# Patient Record
Sex: Male | Born: 1945 | Race: White | Hispanic: No | Marital: Married | Smoking: Never smoker
Health system: Southern US, Community
[De-identification: ages and names within clinical notes are randomized; demographics above are authoritative.]

## PROBLEM LIST (undated history)

## (undated) DIAGNOSIS — Z5189 Encounter for other specified aftercare: Secondary | ICD-10-CM

## (undated) DIAGNOSIS — I10 Essential (primary) hypertension: Secondary | ICD-10-CM

## (undated) DIAGNOSIS — F329 Major depressive disorder, single episode, unspecified: Secondary | ICD-10-CM

## (undated) DIAGNOSIS — H409 Unspecified glaucoma: Secondary | ICD-10-CM

## (undated) DIAGNOSIS — R413 Other amnesia: Secondary | ICD-10-CM

## (undated) DIAGNOSIS — D649 Anemia, unspecified: Secondary | ICD-10-CM

## (undated) DIAGNOSIS — H269 Unspecified cataract: Secondary | ICD-10-CM

## (undated) DIAGNOSIS — J40 Bronchitis, not specified as acute or chronic: Secondary | ICD-10-CM

## (undated) DIAGNOSIS — F32A Depression, unspecified: Secondary | ICD-10-CM

## (undated) DIAGNOSIS — C801 Malignant (primary) neoplasm, unspecified: Secondary | ICD-10-CM

## (undated) DIAGNOSIS — F410 Panic disorder [episodic paroxysmal anxiety] without agoraphobia: Secondary | ICD-10-CM

## (undated) HISTORY — DX: Unspecified cataract: H26.9

## (undated) HISTORY — PX: POLYPECTOMY: SHX149

## (undated) HISTORY — DX: Anemia, unspecified: D64.9

## (undated) HISTORY — PX: COLONOSCOPY: SHX174

## (undated) HISTORY — DX: Unspecified glaucoma: H40.9

## (undated) HISTORY — DX: Malignant (primary) neoplasm, unspecified: C80.1

## (undated) HISTORY — PX: MELANOMA EXCISION: SHX5266

## (undated) HISTORY — DX: Encounter for other specified aftercare: Z51.89

---

## 1998-02-09 ENCOUNTER — Other Ambulatory Visit: Admission: RE | Admit: 1998-02-09 | Discharge: 1998-02-09 | Payer: Self-pay

## 1999-03-16 ENCOUNTER — Emergency Department (HOSPITAL_COMMUNITY): Admission: EM | Admit: 1999-03-16 | Discharge: 1999-03-16 | Payer: Self-pay | Admitting: *Deleted

## 2007-04-30 ENCOUNTER — Emergency Department (HOSPITAL_COMMUNITY): Admission: EM | Admit: 2007-04-30 | Discharge: 2007-04-30 | Payer: Self-pay | Admitting: Emergency Medicine

## 2007-05-13 ENCOUNTER — Ambulatory Visit: Payer: Self-pay | Admitting: Gastroenterology

## 2007-05-24 ENCOUNTER — Encounter: Payer: Self-pay | Admitting: Gastroenterology

## 2007-05-24 ENCOUNTER — Ambulatory Visit: Payer: Self-pay | Admitting: Gastroenterology

## 2008-04-21 ENCOUNTER — Emergency Department (HOSPITAL_COMMUNITY): Admission: EM | Admit: 2008-04-21 | Discharge: 2008-04-21 | Payer: Self-pay | Admitting: Emergency Medicine

## 2008-10-22 ENCOUNTER — Observation Stay (HOSPITAL_COMMUNITY): Admission: EM | Admit: 2008-10-22 | Discharge: 2008-10-23 | Payer: Self-pay | Admitting: *Deleted

## 2008-10-22 ENCOUNTER — Encounter (INDEPENDENT_AMBULATORY_CARE_PROVIDER_SITE_OTHER): Payer: Self-pay | Admitting: Internal Medicine

## 2010-11-17 LAB — CBC
MCHC: 34.3 g/dL (ref 30.0–36.0)
MCHC: 34.5 g/dL (ref 30.0–36.0)
MCV: 97.8 fL (ref 78.0–100.0)
MCV: 99.2 fL (ref 78.0–100.0)
Platelets: 186 10*3/uL (ref 150–400)
RBC: 4.11 MIL/uL — ABNORMAL LOW (ref 4.22–5.81)
RDW: 13.1 % (ref 11.5–15.5)

## 2010-11-17 LAB — BASIC METABOLIC PANEL
BUN: 10 mg/dL (ref 6–23)
BUN: 9 mg/dL (ref 6–23)
CO2: 24 mEq/L (ref 19–32)
CO2: 24 mEq/L (ref 19–32)
Chloride: 101 mEq/L (ref 96–112)
Chloride: 107 mEq/L (ref 96–112)
Creatinine, Ser: 0.94 mg/dL (ref 0.4–1.5)
Creatinine, Ser: 1.1 mg/dL (ref 0.4–1.5)
GFR calc Af Amer: >60 mL/min (ref >60)
Glucose, Bld: 112 mg/dL — ABNORMAL HIGH (ref 70–99)

## 2010-11-17 LAB — POCT CARDIAC MARKERS
Myoglobin, poc: 96.2 ng/mL (ref 12–200)
Troponin i, poc: <0.05 ng/mL (ref 0.00–0.09)

## 2010-11-17 LAB — DIFFERENTIAL
Basophils Relative: 0 % (ref 0–1)
Eosinophils Absolute: 0.1 10*3/uL (ref 0.0–0.7)
Monocytes Relative: 12 % (ref 3–12)
Neutrophils Relative %: 56 % (ref 43–77)

## 2010-12-20 NOTE — Discharge Summary (Signed)
Brian Maxwell, KOMAN         ACCOUNT NO.:  [id number]   MEDICAL RECORD NO.:  [id number]          PATIENT TYPE:  OBV   LOCATION:  1425                         FACILITY:  [facility]   PHYSICIAN:  Theodosia Paling, MD    DATE OF BIRTH:  06/13/1946   DATE OF ADMISSION:  10/22/2008  DATE OF DISCHARGE:  10/23/2008                               DISCHARGE SUMMARY   PRIMARY CARE PHYSICIAN:  Dr. Louanna Raw.   ADMITTING HISTORY:  Please refer to the admission note dictated by me  under history of present illness.   DISCHARGE DIAGNOSES:  1. Nocturnal cough.  2. History of hypertension.  3. Gout.  4. History of glaucoma.   DISCHARGE MEDICATIONS:  The patient is to continue home medications:  1. Wellbutrin 150 mg p.o. daily.  2. Xalatan 1 drop topical daily.  3. Diovan 150 mg p.o. daily.  4. Probenecid and colchicine at whatever dose he was taking before.   New medications added:  1. Robitussin DM 1 teaspoon p.o. q.8h. p.r.n.  2. Maalox 1 teaspoon p.o. q.8h. p.r.n.  3. Sudafed 1 tablet p.o. q.12h. for three days.  4. Azithromycin 250 mg p.o. daily for 3 days.  5. Omeprazole 20 mg p.o. q.12h. for a month.   PROCEDURE PERFORMED:  None.   IMAGING PERFORMED:  1. Chest x-ray on October 22, 2008, showing no acute finding in the      chest.  No radiographic  evidence of pneumonia found.  2. Echocardiogram.  Echocardiogram showing EF of 55%.  The study was      inadequate for evaluation of left ventricular regional wall motion      defect.  Redundancy anterior septum with borderline criteria for      atrial septal aneurysm.   CONSULTATION PERFORMED:  None.   DISPOSITION:  The patient is going home.  He will follow up with Dr.  Louanna Raw in 1 week time for follow-up of his symptoms improvement.   Total time spent in discharge 35 minutes.      Theodosia Paling, MD  Electronically Signed     NP/MEDQ  D:  10/23/2008  T:  10/23/2008  Job:  [id number]   cc:   Louanna Raw  Fax:  [phone]

## 2010-12-20 NOTE — H&P (Signed)
Brian Maxwell, CRANSTON NO.:  [id number]   MEDICAL RECORD NO.:  [id number]          PATIENT TYPE:  OBV   LOCATION:  0101                         FACILITY:  [facility]   PHYSICIAN:  Theodosia Paling, MD    DATE OF BIRTH:  Dec 29, 1945   DATE OF ADMISSION:  10/22/2008  DATE OF DISCHARGE:                              HISTORY & PHYSICAL   PRIMARY CARE PHYSICIAN:  The patient follows with Dr. Louanna Raw.   CHIEF COMPLAINT:  Shortness of breath and bad coughing spells at night.   HISTORY OF PRESENT ILLNESS:  Mr. Brian Maxwell is a 65 year old gentleman  with otherwise good health who is presenting with a chief complaint of  nocturnal dyspnea and coughing spells at night.  He feels better when he  is bending forward.  He says the coughing spells and shortness of breath  are so bad that he has not been able to get a good night's sleep for the  last 2 nights and has come to the emergency room for further evaluation  and management.   PAST MEDICAL HISTORY:  1. Significant for hypertension.  2. Gout.  3. Glaucoma.  4. History of dizziness for which he presented to the ER 6 months      back.   REVIEW OF SYSTEMS:  Essentially negative except for sinus congestion and  nasal congestion.  Denies heartburn.  Denies pedal edema.   SOCIAL HISTORY:  Works as a Haematologist at [employer].  Denies  tobacco, alcohol or IV drug abuse.   FAMILY HISTORY:  Mother died of heart failure.  Otherwise, no other  significant family medical history.   ALLERGIES:  No allergies.   PAST SURGICAL HISTORY:  None.   HOME MEDICATIONS:  The patient takes the following medications:  1. Wellbutrin 150 mg p.o. daily.  2. Xalatan one drop topical daily.  3. Diovan 150 mg p.o. daily.  4. Benecid and colchicine.  He does not remember the dose.  5. Sudafed PE occasionally he takes 1 tablet at bedtime.   PHYSICAL EXAMINATION:  VITAL SIGNS:  Blood pressure 120/80, respiratory  rate 16, oxygen saturation  98% at room air, afebrile.  GENERAL:  No acute cardiorespiratory distress.  LUNGS:  The patient has normal breath sounds.  No rhonchi or  crepitations.  CARDIOVASCULAR:  S1, S2 normal.  No murmur, gallop or rub.  GASTROINTESTINAL:  Soft, obese, nontender.  No organomegaly.  PSYCHIATRIC:  Oriented x3.  CNS:  Speech intact.  Follows commands.  EXTREMITIES:  No pedal edema.   LAB DATA:  CBC - hemoglobin 13.9, hematocrit 40.2, platelet count 208,  sodium 137, potassium 2.9, chloride 107, bicarbonate 24, glucose 112,  BUN 10, creatinine 1.10.  Cardiac enzymes - three set of cardiac enzymes  are negative.  EKG normal sinus rhythm.   ASSESSMENT AND PLAN:  1. Nocturnal cough.  At this time differential includes acute      bronchitis/postnasal drip/gastroesophageal reflux disease causing      dry cough/congestive heart failure.  Will admit the patient and      obtain an echocardiogram.  Continue Sudafed.  We will give him p.o.  azithromycin and proton pump inhibitor.  Most likely the patient      will be able to go home tomorrow if he feels better.  2. Hypertension.  Continue Diovan.  3. Gout.  At this time there is no acute exacerbation.  4. Glaucoma.  Continue eye drops.  5. Prophylaxis.  DVT and GI prophylaxis requested.   CODE STATUS:  The patient is full code.  Total time spent in admission  of this patient is 35 minutes.      Theodosia Paling, MD  Electronically Signed     NP/MEDQ  D:  10/22/2008  T:  10/22/2008  Job:  [id number]   cc:   Louanna Raw  Fax: [phone]

## 2011-05-08 LAB — POCT I-STAT, CHEM 8
BUN: 16
Calcium, Ion: 1.19
HCT: 42
TCO2: 27

## 2011-05-08 LAB — URINALYSIS, ROUTINE W REFLEX MICROSCOPIC
Ketones, ur: NEGATIVE
Nitrite: NEGATIVE
Protein, ur: NEGATIVE

## 2011-05-18 LAB — HEPATIC FUNCTION PANEL
Albumin: 4
Alkaline Phosphatase: 68
Total Protein: 6.7

## 2011-05-18 LAB — I-STAT 8, (EC8 V) (CONVERTED LAB)
Acid-Base Excess: 1
Bicarbonate: 24.9 — ABNORMAL HIGH
HCT: 44
Operator id: 294501
TCO2: 26
pCO2, Ven: 38.1 — ABNORMAL LOW
pH, Ven: 7.424 — ABNORMAL HIGH

## 2011-05-18 LAB — DIFFERENTIAL
Basophils Absolute: 0
Eosinophils Absolute: 0.1
Lymphs Abs: 1.9
Neutrophils Relative %: 65

## 2011-05-18 LAB — CBC
MCV: 94.5
Platelets: 244
RDW: 12.8
WBC: 7.9

## 2011-05-18 LAB — POCT I-STAT CREATININE
Creatinine, Ser: 1.1
Operator id: 294501

## 2011-05-18 LAB — PROTIME-INR
INR: 0.9
Prothrombin Time: 12.7

## 2012-01-19 ENCOUNTER — Other Ambulatory Visit: Payer: Self-pay | Admitting: Dermatology

## 2012-01-23 ENCOUNTER — Other Ambulatory Visit: Payer: Self-pay | Admitting: Dermatology

## 2012-02-01 ENCOUNTER — Other Ambulatory Visit: Payer: Self-pay | Admitting: Dermatology

## 2012-02-15 ENCOUNTER — Other Ambulatory Visit: Payer: Self-pay | Admitting: Dermatology

## 2012-03-27 ENCOUNTER — Encounter: Payer: Self-pay | Admitting: Gastroenterology

## 2012-04-07 ENCOUNTER — Encounter (HOSPITAL_COMMUNITY): Payer: Self-pay | Admitting: *Deleted

## 2012-04-07 ENCOUNTER — Emergency Department (HOSPITAL_COMMUNITY): Payer: BC Managed Care – PPO

## 2012-04-07 ENCOUNTER — Emergency Department (HOSPITAL_COMMUNITY)
Admission: EM | Admit: 2012-04-07 | Discharge: 2012-04-07 | Disposition: A | Discharge status: Home / Self Care | Payer: BC Managed Care – PPO | Attending: Emergency Medicine | Admitting: Emergency Medicine

## 2012-04-07 DIAGNOSIS — R0602 Shortness of breath: Secondary | ICD-10-CM | POA: Insufficient documentation

## 2012-04-07 DIAGNOSIS — Z79899 Other long term (current) drug therapy: Secondary | ICD-10-CM | POA: Insufficient documentation

## 2012-04-07 DIAGNOSIS — F329 Major depressive disorder, single episode, unspecified: Secondary | ICD-10-CM | POA: Insufficient documentation

## 2012-04-07 DIAGNOSIS — I10 Essential (primary) hypertension: Secondary | ICD-10-CM | POA: Insufficient documentation

## 2012-04-07 DIAGNOSIS — F3289 Other specified depressive episodes: Secondary | ICD-10-CM | POA: Insufficient documentation

## 2012-04-07 DIAGNOSIS — F411 Generalized anxiety disorder: Secondary | ICD-10-CM | POA: Insufficient documentation

## 2012-04-07 DIAGNOSIS — F419 Anxiety disorder, unspecified: Secondary | ICD-10-CM

## 2012-04-07 HISTORY — DX: Essential (primary) hypertension: I10

## 2012-04-07 HISTORY — DX: Depression, unspecified: F32.A

## 2012-04-07 HISTORY — DX: Major depressive disorder, single episode, unspecified: F32.9

## 2012-04-07 LAB — HEPATIC FUNCTION PANEL
ALT: 20 U/L (ref 0–53)
AST: 24 U/L (ref 0–37)
Albumin: 4.3 g/dL (ref 3.5–5.2)
Total Protein: 7.1 g/dL (ref 6.0–8.3)

## 2012-04-07 LAB — CBC WITH DIFFERENTIAL/PLATELET
Hemoglobin: 14.1 g/dL (ref 13.0–17.0)
Lymphocytes Relative: 28 % (ref 12–46)
Lymphs Abs: 2.3 10*3/uL (ref 0.7–4.0)
MCH: 33.6 pg (ref 26.0–34.0)
MCV: 93.8 fL (ref 78.0–100.0)
Monocytes Relative: 6 % (ref 3–12)
Neutrophils Relative %: 64 % (ref 43–77)
Platelets: 222 10*3/uL (ref 150–400)
RBC: 4.2 MIL/uL — ABNORMAL LOW (ref 4.22–5.81)
WBC: 7.9 10*3/uL (ref 4.0–10.5)

## 2012-04-07 LAB — URINALYSIS, ROUTINE W REFLEX MICROSCOPIC
Glucose, UA: NEGATIVE mg/dL
Hgb urine dipstick: NEGATIVE
Leukocytes, UA: NEGATIVE
pH: 6 (ref 5.0–8.0)

## 2012-04-07 LAB — BASIC METABOLIC PANEL
CO2: 23 mEq/L (ref 19–32)
Calcium: 10.1 mg/dL (ref 8.4–10.5)
GFR calc non Af Amer: 87 mL/min — ABNORMAL LOW (ref >90)
Glucose, Bld: 98 mg/dL (ref 70–99)
Potassium: 4.5 mEq/L (ref 3.5–5.1)
Sodium: 137 mEq/L (ref 135–145)

## 2012-04-07 LAB — PROTIME-INR
INR: 1.01 (ref 0.00–1.49)
Prothrombin Time: 13.5 seconds (ref 11.6–15.2)

## 2012-04-07 LAB — APTT: aPTT: 30 seconds (ref 24–37)

## 2012-04-07 LAB — TROPONIN I
Troponin I: <0.3 ng/mL (ref <0.30)
Troponin I: <0.3 ng/mL (ref <0.30)

## 2012-04-07 LAB — D-DIMER, QUANTITATIVE: D-Dimer, Quant: <0.22 ug/mL-FEU (ref 0.00–0.48)

## 2012-04-07 MED ORDER — ALPRAZOLAM 0.25 MG PO TABS: 0.2500 mg | ORAL_TABLET | Freq: Every evening | ORAL | Status: AC | PRN

## 2012-04-07 MED ORDER — ASPIRIN 81 MG PO CHEW
162.0000 mg | CHEWABLE_TABLET | Freq: Once | ORAL | Status: AC
  Administered 2012-04-07: 162 mg via ORAL
  Filled 2012-04-07: qty 2

## 2012-04-07 NOTE — ED Notes (Signed)
Pt reports he has been taking his double his dose of allopurinol, thinks he has been taking 200mg  a day instead of 100mg . Pt states he also been taking 1000mg  a day for sleep aid. Pt concerned he has been taking to much medications and something could be wrong.

## 2012-04-07 NOTE — ED Provider Notes (Signed)
History   This chart was scribed for Derwood Kaplan, MD by Melba Coon. The patient was seen in room TR07C/TR07C and the patient's care was started at 11:51AM.    CSN: [id number]  Arrival date & time 04/07/12  1030   First MD Initiated Contact with Patient 04/07/12 1104      Chief Complaint  Patient presents with  . Follow-up    (Consider location/radiation/quality/duration/timing/severity/associated sxs/prior treatment) The history is provided by the patient. No language interpreter was used.   Brian Maxwell is a 66 y.o. male who presents to the Emergency Department complaining of persistent, moderate to severe shortness of breath with 3 days ago. Pt has been accidentally doubling his dose of allopurinol, taking 200mg /day instead of 100mg , for 2.5 months. Pt has also been having trouble with taking his sleep aid medications, taking excess acetaminophen, for 6 weeks. Pt is concerned that his overconsumption of medications mixed with anxiety is what is causing the present symptoms. 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 Chest "constriction" present, not pain. Walking fast and physical exertion aggravates the SOB. No HA, fever, neck pain, sore throat, rash, back pain, CP, abd pain, n/v/d, dysuria, or extremity pain, edema, weakness, numbness, or tingling. Hx of anxiety and HTN. PERC negative. Pt had a stress test about 6 months ago and pt states results were unremarkable. No known allergies. No other pertinent medical symptoms.  PCP: Ardeth Sportsman, PA; at [facility]   Past Medical History  Diagnosis Date  . Hypertension   . Depression     History reviewed. No pertinent past surgical history.  History reviewed. No pertinent family history.  History  Substance Use Topics  . Smoking status: Never Smoker   . Smokeless tobacco: Not on file  . Alcohol Use:       Review of Systems 10 Systems reviewed and all are negative for  acute change except as noted in the HPI.   Allergies  Review of patient's allergies indicates not on file.  Home Medications   Current Outpatient Rx  Name Route Sig Dispense Refill  . ALLOPURINOL 100 MG PO TABS Oral Take 100 mg by mouth daily.    . BUPROPION HCL ER (XL) 150 MG PO TB24 Oral Take 150 mg by mouth daily. With supper.    Marland Kitchen CALCIUM CITRATE PO Oral Take 2 tablets by mouth every morning.    . CO Q-10 300 MG PO CAPS Oral Take 1 capsule by mouth daily.    Marland Kitchen DIPHENHYDRAMINE-APAP (SLEEP) 25-500 MG PO TABS Oral Take 2 tablets by mouth at bedtime as needed. As needed for sleep.    Marland Kitchen GLUCOSAMINE 1500 COMPLEX PO Oral Take 1,500 mg by mouth daily.    Marland Kitchen KRILL OIL PO Oral Take 1 capsule by mouth daily.    Marland Kitchen OVER THE COUNTER MEDICATION Oral Take 1 tablet by mouth daily. Ginko Biloba 120 mg    . OVER THE COUNTER MEDICATION Oral Take 8 tablets by mouth daily. [employer] Performance and Vitality Vitapak.    Marland Kitchen OVER THE COUNTER MEDICATION Oral Take 1 tablet by mouth daily. Chondroitin 1200 mg.    . TRAVOPROST 0.004 % OP SOLN Both Eyes Place 1 drop into both eyes at bedtime.    Marland Kitchen VALSARTAN 160 MG PO TABS Oral Take 160 mg by mouth daily.      BP 167/97  Pulse 89  Temp 98.1 F (36.7 C) (Oral)  Resp 20  SpO2  97%  Physical Exam  Nursing note and vitals reviewed. Constitutional: He is oriented to person, place, and time. He appears well-developed and well-nourished.  HENT:  Head: Normocephalic and atraumatic.  Eyes: EOM are normal.  Neck: Normal range of motion. Neck supple.       No JVD  Cardiovascular: Normal rate, normal heart sounds and intact distal pulses.   No murmur heard. Pulmonary/Chest: Effort normal and breath sounds normal. No respiratory distress. He has no wheezes. He has no rales.  Abdominal: Soft. Bowel sounds are normal. He exhibits no distension. There is no tenderness.  Musculoskeletal: Normal range of motion. He exhibits no edema (no lower extremity swelling or pitting  edema) and no tenderness.  Neurological: He is alert and oriented to person, place, and time. He has normal strength. No cranial nerve deficit or sensory deficit.  Skin: Skin is warm and dry. No rash noted.  Psychiatric: He has a normal mood and affect.    ED Course  Procedures (including critical care time)  DIAGNOSTIC STUDIES: Oxygen Saturation is 97% on room air, normal by my interpretation.    COORDINATION OF CARE:  12:01PM - EKG, CXR, troponin, blood w/u will be ordered for the pt. 12:38PM - consult with poison control on allopurinol and its effects was performed   Labs Reviewed - No data to display No results found.   No diagnosis found.    MDM   Date: 04/07/2012  Rate: 84  Rhythm: normal sinus rhythm  QRS Axis: normal  Intervals: normal  ST/T Wave abnormalities: normal  Conduction Disutrbances:none  Narrative Interpretation:   Old EKG Reviewed: unchanged  Pt comes in with cc of accidental OD of allopurinol. Pt is having some dyspnea - not particular -sometimes exertional, sometimes no. No chest pain assoicated with that. States that he has hx of anxiety - but hasnt had to deal with it over the past 19 years. Taken Xanax in the past. Pt's anxiety almost led him to cancel his classes. Pt also reports taking allopurinol 200 mg q daily instead of 100 mg q daily for 6 weeks - this dose is still in the therapeutic range. i called poison control and they have no further recommendations. Pt's cardiac risk factors include age and HTN, he had a negative stress in March.  Plan 2 enzymes, ekg, CXR, r/o PE.  Medical screening examination/treatment/procedure(s) were performed by me as the supervising physician. Scribe service was utilized for documentation only.          Derwood Kaplan, MD 04/07/12 1342

## 2012-04-07 NOTE — ED Notes (Signed)
Pt states that he wants to make sure that he has not been taking to much allipurinol and tylenol PM. Pt states that he has had SOB and anxiety for about a week. Pt states that he looked on Web MD and saw that those were side effects of the medications that he has been taking. Pt states that he has not had anxiety attacks in 19 years.

## 2012-04-17 ENCOUNTER — Other Ambulatory Visit: Payer: Self-pay | Admitting: Dermatology

## 2012-05-01 ENCOUNTER — Other Ambulatory Visit: Payer: Self-pay | Admitting: Dermatology

## 2012-06-12 ENCOUNTER — Ambulatory Visit (AMBULATORY_SURGERY_CENTER): Payer: BC Managed Care – PPO | Admitting: *Deleted

## 2012-06-12 ENCOUNTER — Encounter: Payer: Self-pay | Admitting: Gastroenterology

## 2012-06-12 VITALS — Ht 70.0 in | Wt 218.9 lb

## 2012-06-12 DIAGNOSIS — Z1211 Encounter for screening for malignant neoplasm of colon: Secondary | ICD-10-CM

## 2012-06-12 DIAGNOSIS — Z86010 Personal history of colonic polyps: Secondary | ICD-10-CM

## 2012-06-12 MED ORDER — MOVIPREP 100 G PO SOLR: 1.0000 | Freq: Once | ORAL | Status: DC

## 2012-06-21 ENCOUNTER — Ambulatory Visit (AMBULATORY_SURGERY_CENTER): Payer: BC Managed Care – PPO | Admitting: Gastroenterology

## 2012-06-21 ENCOUNTER — Encounter: Payer: Self-pay | Admitting: Gastroenterology

## 2012-06-21 VITALS — BP 109/66 | HR 63 | Temp 97.7°F | Resp 17 | Ht 70.0 in | Wt 218.0 lb

## 2012-06-21 DIAGNOSIS — D126 Benign neoplasm of colon, unspecified: Secondary | ICD-10-CM

## 2012-06-21 DIAGNOSIS — Z1211 Encounter for screening for malignant neoplasm of colon: Secondary | ICD-10-CM

## 2012-06-21 DIAGNOSIS — Z86010 Personal history of colonic polyps: Secondary | ICD-10-CM

## 2012-06-21 MED ORDER — SODIUM CHLORIDE 0.9 % IV SOLN: 500.0000 mL | INTRAVENOUS | Status: DC

## 2012-06-21 NOTE — Op Note (Signed)
 [facility] [address] [place], [address]   COLONOSCOPY PROCEDURE REPORT  PATIENT: Brian Maxwell, Brian Maxwell  MR#: [id number] BIRTHDATE: Mar 12, 1946 , 65  yrs. old GENDER: Male ENDOSCOPIST: Meryl Dare, MD, [facility] PROCEDURE DATE:  06/21/2012 PROCEDURE:   Colonoscopy with snare polypectomy ASA CLASS:   Class II INDICATIONS: patient's personal history of adenomatous colon polyps.  MEDICATIONS: MAC sedation, administered by CRNA and propofol (Diprivan) 160mg  IV DESCRIPTION OF PROCEDURE:   After the risks benefits and alternatives of the procedure were thoroughly explained, informed consent was obtained.  A digital rectal exam revealed no abnormalities of the rectum.   The LB CF-[id number]  endoscope was introduced through the anus and advanced to the cecum, which was identified by both the appendix and ileocecal valve. No adverse events experienced.   The quality of the prep was excellent, using MoviPrep  The instrument was then slowly withdrawn as the colon was fully examined.  COLON FINDINGS: Two sessile polyps measuring 6-7 mm in size were found in the ascending colon.  A polypectomies  were performed with a cold snare.  The resections were complete and the polyp tissue was completely retrieved.   The colon mucosa was otherwise normal. Retroflexed views revealed small internal hemorrhoids. The time to cecum=1 minutes 32 seconds.  Withdrawal time=9 minutes 26 seconds. The scope was withdrawn and the procedure completed.  COMPLICATIONS: There were no complications.  ENDOSCOPIC IMPRESSION: 1.   Two sessile polyps in the ascending colon; polypectomy was performed with a cold snare 2.   Small internal hemorrhoids  RECOMMENDATIONS: 1.  Await pathology results 2.  Repeat colonoscopy in 3 years if polyps adenomatous; otherwise 5 years   eSigned:  Meryl Dare, MD, [facility] 06/21/2012 9:17 AM

## 2012-06-21 NOTE — Patient Instructions (Addendum)
Impressions/Recommendations:  Polyps- handout given Hemorrhoids-handout given  Repeat colonoscopy pending pathology results.  YOU HAD AN ENDOSCOPIC PROCEDURE TODAY AT THE [facility]: Refer to the procedure report that was given to you for any specific questions about what was found during the examination.  If the procedure report does not answer your questions, please call your gastroenterologist to clarify.  If you requested that your care partner not be given the details of your procedure findings, then the procedure report has been included in a sealed envelope for you to review at your convenience later.  YOU SHOULD EXPECT: Some feelings of bloating in the abdomen. Passage of more gas than usual.  Walking can help get rid of the air that was put into your GI tract during the procedure and reduce the bloating. If you had a lower endoscopy (such as a colonoscopy or flexible sigmoidoscopy) you may notice spotting of blood in your stool or on the toilet paper. If you underwent a bowel prep for your procedure, then you may not have a normal bowel movement for a few days.  DIET: Your first meal following the procedure should be a light meal and then it is ok to progress to your normal diet.  A half-sandwich or bowl of soup is an example of a good first meal.  Heavy or fried foods are harder to digest and may make you feel nauseous or bloated.  Likewise meals heavy in dairy and vegetables can cause extra gas to form and this can also increase the bloating.  Drink plenty of fluids but you should avoid alcoholic beverages for 24 hours.  ACTIVITY: Your care partner should take you home directly after the procedure.  You should plan to take it easy, moving slowly for the rest of the day.  You can resume normal activity the day after the procedure however you should NOT DRIVE or use heavy machinery for 24 hours (because of the sedation medicines used during the test).    SYMPTOMS TO REPORT  IMMEDIATELY: A gastroenterologist can be reached at any hour.  During normal business hours, 8:30 AM to 5:00 PM Monday through Friday, call [phone].  After hours and on weekends, please call the GI answering service at [phone] who will take a message and have the physician on call contact you.   Following lower endoscopy (colonoscopy or flexible sigmoidoscopy):  Excessive amounts of blood in the stool  Significant tenderness or worsening of abdominal pains  Swelling of the abdomen that is new, acute  Fever of 100F or higher   FOLLOW UP: If any biopsies were taken you will be contacted by phone or by letter within the next 1-3 weeks.  Call your gastroenterologist if you have not heard about the biopsies in 3 weeks.  Our staff will call the home number listed on your records the next business day following your procedure to check on you and address any questions or concerns that you may have at that time regarding the information given to you following your procedure. This is a courtesy call and so if there is no answer at the home number and we have not heard from you through the emergency physician on call, we will assume that you have returned to your regular daily activities without incident.  SIGNATURES/CONFIDENTIALITY: You and/or your care partner have signed paperwork which will be entered into your electronic medical record.  These signatures attest to the fact that that the information above on your After Visit  Summary has been reviewed and is understood.  Full responsibility of the confidentiality of this discharge information lies with you and/or your care-partner.  

## 2012-06-21 NOTE — Progress Notes (Signed)
Patient did not experience any of the following events: a burn prior to discharge; a fall within the facility; wrong site/side/patient/procedure/implant event; or a hospital transfer or hospital admission upon discharge from the facility. (G8907) Patient did not have preoperative order for IV antibiotic SSI prophylaxis. (G8918)  

## 2012-06-24 ENCOUNTER — Telehealth: Payer: Self-pay | Admitting: *Deleted

## 2012-06-24 NOTE — Telephone Encounter (Signed)
  Follow up Call-  Call back number 06/21/2012  Post procedure Call Back phone  # [phone]  Permission to leave phone message Yes     Patient questions:  Do you have a fever, pain , or abdominal swelling? no Pain Score  0 *  Have you tolerated food without any problems? yes  Have you been able to return to your normal activities? yes  Do you have any questions about your discharge instructions: Diet   no Medications  no Follow up visit  no  Do you have questions or concerns about your Care? no  Actions: * If pain score is 4 or above: No action needed, pain <4.  You all did a wonderful job.

## 2012-06-25 ENCOUNTER — Encounter: Payer: Self-pay | Admitting: Gastroenterology

## 2012-11-18 ENCOUNTER — Other Ambulatory Visit: Payer: Self-pay | Admitting: Dermatology

## 2013-04-07 ENCOUNTER — Encounter (HOSPITAL_COMMUNITY): Payer: Self-pay | Admitting: Emergency Medicine

## 2013-04-07 ENCOUNTER — Emergency Department (HOSPITAL_COMMUNITY)
Admission: EM | Admit: 2013-04-07 | Discharge: 2013-04-07 | Disposition: A | Discharge status: Home / Self Care | Payer: Medicare Other | Attending: Emergency Medicine | Admitting: Emergency Medicine

## 2013-04-07 DIAGNOSIS — F419 Anxiety disorder, unspecified: Secondary | ICD-10-CM

## 2013-04-07 DIAGNOSIS — I10 Essential (primary) hypertension: Secondary | ICD-10-CM | POA: Insufficient documentation

## 2013-04-07 DIAGNOSIS — F341 Dysthymic disorder: Secondary | ICD-10-CM | POA: Insufficient documentation

## 2013-04-07 DIAGNOSIS — H409 Unspecified glaucoma: Secondary | ICD-10-CM | POA: Insufficient documentation

## 2013-04-07 DIAGNOSIS — Z85820 Personal history of malignant melanoma of skin: Secondary | ICD-10-CM | POA: Insufficient documentation

## 2013-04-07 DIAGNOSIS — Z862 Personal history of diseases of the blood and blood-forming organs and certain disorders involving the immune mechanism: Secondary | ICD-10-CM | POA: Insufficient documentation

## 2013-04-07 DIAGNOSIS — Z79899 Other long term (current) drug therapy: Secondary | ICD-10-CM | POA: Insufficient documentation

## 2013-04-07 HISTORY — DX: Panic disorder (episodic paroxysmal anxiety): F41.0

## 2013-04-07 NOTE — ED Provider Notes (Signed)
CSN: [id number]     Arrival date & time 04/07/13  0033 History   First MD Initiated Contact with Patient 04/07/13 0104     Chief Complaint  Patient presents with  . Anxiety   HPI  History provided by the patient. The patient is a 67 year old male with history of anxiety, depression and hypertension who presents with uncontrolled anxiety this evening. 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 He did not realize how this would impact him and his anxiety but this has. He also reports having anxiety about being unable to sell his home. Patient was given prescription for Xanax by his PCP Dr. Lenise Arena. He was told to take half of a 0.25 mg tab in the morning and at night and to only use a whole 0.25 mg tablet if needed. Patient did end up using a full tab this evening but did not have significant improvement of his symptoms. After deciding to drive to the emergency department he was feeling more relaxed and currently he reports feeling more calm. He is concerned about having return of his symptoms. He denies feeling depressed, SI or HI. He is requesting to have some referrals to follow up with a psychiatric specialist. No other aggravating or alleviating factors. No other associated symptoms.    Past Medical History  Diagnosis Date  . Hypertension   . Depression   . Blood transfusion without reported diagnosis     age 27  . Cancer     melanoma calf and back  . Glaucoma   . Cataract   . Anemia     as child  . Anxiety attack    Past Surgical History  Procedure Laterality Date  . Melanoma excision    . Tonsillectomy      pt unsure  . Colonoscopy    . Polypectomy     Family History  Problem Relation Age of Onset  . Colon cancer Neg Hx   . Rectal cancer Neg Hx   . Stomach cancer Neg Hx    History  Substance Use Topics  . Smoking status: Never Smoker   . Smokeless tobacco: Never Used  . Alcohol Use: Yes     Comment: rare-once every few  months maybe    Review of Systems  Constitutional: Negative for fever.  Respiratory: Negative for shortness of breath.   Cardiovascular: Negative for chest pain.  Neurological: Negative for headaches.  Psychiatric/Behavioral: Negative for suicidal ideas and self-injury. The patient is nervous/anxious.   All other systems reviewed and are negative.    Allergies  Diphenhydramine  Home Medications   Current Outpatient Rx  Name  Route  Sig  Dispense  Refill  . allopurinol (ZYLOPRIM) 100 MG tablet   Oral   Take 100 mg by mouth daily.         Marland Kitchen ALPRAZolam (XANAX) 0.25 MG tablet   Oral   Take 0.25 mg by mouth 2 (two) times daily.          Marland Kitchen buPROPion (WELLBUTRIN XL) 300 MG 24 hr tablet   Oral   Take 300 mg by mouth daily.         Marland Kitchen CALCIUM CITRATE PO   Oral   Take 2 tablets by mouth every morning.         . Coenzyme Q10 (CO Q-10) 300 MG CAPS   Oral   Take 1 capsule by mouth daily.         Marland Kitchen  Glucosamine-Chondroit-Vit C-Mn (GLUCOSAMINE 1500 COMPLEX PO)   Oral   Take 1,500 mg by mouth daily.         Marland Kitchen omega-3 acid ethyl esters (LOVAZA) 1 G capsule   Oral   Take 1 g by mouth 2 (two) times daily.         Marland Kitchen OVER THE COUNTER MEDICATION   Oral   Take 1 tablet by mouth daily. Ginko Biloba 120 mg         . OVER THE COUNTER MEDICATION   Oral   Take 8 tablets by mouth daily. [employer] Performance and Vitality Vitapak.         . travoprost, benzalkonium, (TRAVATAN) 0.004 % ophthalmic solution   Both Eyes   Place 1 drop into both eyes at bedtime.         . valsartan (DIOVAN) 80 MG tablet   Oral   Take 80 mg by mouth daily.          BP 155/95  Pulse 86  Temp(Src) 97.8 F (36.6 C) (Oral)  Resp 18  SpO2 98% Physical Exam  Nursing note and vitals reviewed. Constitutional: He is oriented to person, place, and time. He appears well-developed and well-nourished. No distress.  HENT:  Head: Normocephalic.  Cardiovascular: Normal rate and regular rhythm.    No murmur heard. Pulmonary/Chest: Effort normal and breath sounds normal. No respiratory distress. He has no wheezes. He has no rales.  Abdominal: Soft.  Neurological: He is alert and oriented to person, place, and time.  Skin: Skin is warm.  Psychiatric: He has a normal mood and affect. His behavior is normal.    ED Course  Procedures      MDM   1. Anxiety    Patient seen and evaluated. The patient, at this time does not appear in any acute distress. He does feel better and does not think he needs any additional treatments at this time for his anxiety. Denies any SI or HI. Plan to provide outpatient psychiatric resources.    Angus Seller, PA-C 04/07/13 [phone]

## 2013-04-07 NOTE — ED Notes (Addendum)
Pt states that he had severe anxiety this afternoon that his prescribed xanax did not help with. He states there were no physical symptoms. Just internal.

## 2013-04-07 NOTE — ED Provider Notes (Signed)
Medical screening examination/treatment/procedure(s) were performed by non-physician practitioner and as supervising physician I was immediately available for consultation/collaboration.  Julie Manly, MD 04/07/13 0834 

## 2013-04-07 NOTE — ED Notes (Signed)
Pt.reports anxiety attack this evening unrelieved by prescription Xanax.

## 2013-05-05 ENCOUNTER — Encounter (HOSPITAL_COMMUNITY): Payer: Self-pay | Admitting: *Deleted

## 2013-05-05 ENCOUNTER — Emergency Department (HOSPITAL_COMMUNITY)
Admission: EM | Admit: 2013-05-05 | Discharge: 2013-05-05 | Disposition: A | Discharge status: Home / Self Care | Payer: Medicare Other | Attending: Emergency Medicine | Admitting: Emergency Medicine

## 2013-05-05 DIAGNOSIS — F3289 Other specified depressive episodes: Secondary | ICD-10-CM | POA: Insufficient documentation

## 2013-05-05 DIAGNOSIS — F411 Generalized anxiety disorder: Secondary | ICD-10-CM | POA: Insufficient documentation

## 2013-05-05 DIAGNOSIS — R450 Nervousness: Secondary | ICD-10-CM | POA: Insufficient documentation

## 2013-05-05 DIAGNOSIS — I10 Essential (primary) hypertension: Secondary | ICD-10-CM | POA: Insufficient documentation

## 2013-05-05 DIAGNOSIS — Z85820 Personal history of malignant melanoma of skin: Secondary | ICD-10-CM | POA: Insufficient documentation

## 2013-05-05 DIAGNOSIS — H409 Unspecified glaucoma: Secondary | ICD-10-CM | POA: Insufficient documentation

## 2013-05-05 DIAGNOSIS — H269 Unspecified cataract: Secondary | ICD-10-CM | POA: Insufficient documentation

## 2013-05-05 DIAGNOSIS — F329 Major depressive disorder, single episode, unspecified: Secondary | ICD-10-CM

## 2013-05-05 DIAGNOSIS — F419 Anxiety disorder, unspecified: Secondary | ICD-10-CM

## 2013-05-05 DIAGNOSIS — Z79899 Other long term (current) drug therapy: Secondary | ICD-10-CM | POA: Insufficient documentation

## 2013-05-05 DIAGNOSIS — Z862 Personal history of diseases of the blood and blood-forming organs and certain disorders involving the immune mechanism: Secondary | ICD-10-CM | POA: Insufficient documentation

## 2013-05-05 MED ORDER — ZOLPIDEM TARTRATE 5 MG PO TABS: 5.0000 mg | ORAL_TABLET | Freq: Every evening | ORAL | Status: DC | PRN

## 2013-05-05 NOTE — ED Provider Notes (Signed)
CSN: [id number]     Arrival date & time 05/05/13  3086 History   First MD Initiated Contact with Patient 05/05/13 [id number]     Chief Complaint  Patient presents with  . Anxiety  . Depression   (Consider location/radiation/quality/duration/timing/severity/associated sxs/prior Treatment) HPI Comments: Patient is a 67 year old male with history of anxiety and depression who presents today for worsening anxiety and depression. This all began a few months ago when he decided to begin working part time. He has been having anxiety about his house not selling and only getting half the income he used to. He was seen in the emergency department and given Xanax approximately 1 month ago. He reports that this did not help his anxiety. He has been seeing a psychiatrist and was placed on Klonopin. Last Thursday he began weaning off the Klonopin. He reports a generally he gets 5 hours of sleep, but last night only got 3 hours of sleep. He also takes 300 mg of Wellbutrin. He's been taking Wellbutrin for many years and is concerned that the Wellbutrin is no longer working. He has spoken with his psychiatrist's nurse who recommended not taking her sleeping pills while on the Klonopin. He is here for a med adjustment. He denies shortness of breath, chest pain, nausea, vomiting, abdominal pain, numbness, weakness, paresthesias. He denies any suicidal ideations and hallucinations. No drug use.  The history is provided by the patient. No language interpreter was used.    Past Medical History  Diagnosis Date  . Hypertension   . Depression   . Blood transfusion without reported diagnosis     age 46  . Cancer     melanoma calf and back  . Glaucoma   . Cataract   . Anemia     as child  . Anxiety attack    Past Surgical History  Procedure Laterality Date  . Melanoma excision    . Tonsillectomy      pt unsure  . Colonoscopy    . Polypectomy     Family History  Problem Relation Age of Onset  . Colon cancer Neg  Hx   . Rectal cancer Neg Hx   . Stomach cancer Neg Hx    History  Substance Use Topics  . Smoking status: Never Smoker   . Smokeless tobacco: Never Used  . Alcohol Use: Yes     Comment: rare-once every few months maybe    Review of Systems  Constitutional: Negative for fever and chills.  Respiratory: Negative for shortness of breath.   Cardiovascular: Negative for chest pain.  Gastrointestinal: Negative for nausea, vomiting and abdominal pain.  Psychiatric/Behavioral: Positive for sleep disturbance. The patient is nervous/anxious.   All other systems reviewed and are negative.    Allergies  Diphenhydramine  Home Medications   Current Outpatient Rx  Name  Route  Sig  Dispense  Refill  . allopurinol (ZYLOPRIM) 100 MG tablet   Oral   Take 100 mg by mouth daily.         Marland Kitchen ALPRAZolam (XANAX) 0.25 MG tablet   Oral   Take 0.25 mg by mouth 2 (two) times daily.          Marland Kitchen buPROPion (WELLBUTRIN XL) 300 MG 24 hr tablet   Oral   Take 300 mg by mouth daily.         Marland Kitchen CALCIUM CITRATE PO   Oral   Take 2 tablets by mouth every morning.         Marland Kitchen  Coenzyme Q10 (CO Q-10) 300 MG CAPS   Oral   Take 1 capsule by mouth daily.         . Glucosamine-Chondroit-Vit C-Mn (GLUCOSAMINE 1500 COMPLEX PO)   Oral   Take 1,500 mg by mouth daily.         Marland Kitchen omega-3 acid ethyl esters (LOVAZA) 1 G capsule   Oral   Take 1 g by mouth 2 (two) times daily.         Marland Kitchen OVER THE COUNTER MEDICATION   Oral   Take 1 tablet by mouth daily. Ginko Biloba 120 mg         . OVER THE COUNTER MEDICATION   Oral   Take 8 tablets by mouth daily. [employer]ality [employer].         . travoprost, benzalkonium, (TRAVATAN) 0.004 % ophthalmic solution   Both Eyes   Place 1 drop into both eyes at bedtime.         . valsartan (DIOVAN) 80 MG tablet   Oral   Take 80 mg by mouth daily.          BP 136/83  Pulse 77  Temp(Src) 98 F (36.7 C) (Oral)  Resp 16  SpO2 98% Physical  Exam  Nursing note and vitals reviewed. Constitutional: He is oriented to person, place, and time. He appears well-developed and well-nourished. No distress.  HENT:  Head: Normocephalic and atraumatic.  Right Ear: External ear normal.  Left Ear: External ear normal.  Nose: Nose normal.  Eyes: Conjunctivae are normal.  Neck: Normal range of motion. No tracheal deviation present.  Cardiovascular: Normal rate, regular rhythm and normal heart sounds.   Pulmonary/Chest: Effort normal and breath sounds normal. No stridor.  Abdominal: Soft. He exhibits no distension. There is no tenderness.  Musculoskeletal: Normal range of motion.  Neurological: He is alert and oriented to person, place, and time.  Skin: Skin is warm and dry. He is not diaphoretic.  Psychiatric: He has a normal mood and affect. His behavior is normal. He expresses no homicidal and no suicidal ideation.    ED Course  Procedures (including critical care time) Labs Review Labs Reviewed - No data to display Imaging Review No results found.  MDM   1. Anxiety   2. Depression    Patient presents with worsening anxiety and depression. No suicidal or homicidal ideations. Patient is currently tapering down on his Klonopin. He has his own psychiatrist who he can follow up with. He was given 5 Ambien and told to followup with his psychiatrist as an outpatient. Patient is reasonable and I trust that he will do this as well as return with worsening symptoms. Physical exam is unremarkable. Discussed case with Dr. Elesa Massed who agrees with plan. Return instructions given. Vital signs stable for discharge. Patient / Family / Caregiver informed of clinical course, understand medical decision-making process, and agree with plan.     Mora Bellman, PA-C 05/05/13 1538

## 2013-05-05 NOTE — ED Provider Notes (Signed)
Medical screening examination/treatment/procedure(s) were performed by non-physician practitioner and as supervising physician I was immediately available for consultation/collaboration.  Hansika Leaming N Annalisse Minkoff, DO 05/05/13 1608 

## 2013-05-05 NOTE — ED Notes (Signed)
Pt reports is in the middle of tapering off of klonopin- concerned about a few waves of depression and lack of sleep. Denies SI/HI.

## 2013-05-06 ENCOUNTER — Emergency Department (HOSPITAL_COMMUNITY)
Admission: EM | Admit: 2013-05-06 | Discharge: 2013-05-06 | Disposition: A | Discharge status: Home / Self Care | Payer: Medicare Other | Attending: Emergency Medicine | Admitting: Emergency Medicine

## 2013-05-06 ENCOUNTER — Encounter (HOSPITAL_COMMUNITY): Payer: Self-pay | Admitting: Adult Health

## 2013-05-06 ENCOUNTER — Emergency Department (HOSPITAL_COMMUNITY): Payer: Medicare Other

## 2013-05-06 DIAGNOSIS — F329 Major depressive disorder, single episode, unspecified: Secondary | ICD-10-CM | POA: Insufficient documentation

## 2013-05-06 DIAGNOSIS — F411 Generalized anxiety disorder: Secondary | ICD-10-CM | POA: Insufficient documentation

## 2013-05-06 DIAGNOSIS — Z79899 Other long term (current) drug therapy: Secondary | ICD-10-CM | POA: Insufficient documentation

## 2013-05-06 DIAGNOSIS — Z8669 Personal history of other diseases of the nervous system and sense organs: Secondary | ICD-10-CM | POA: Insufficient documentation

## 2013-05-06 DIAGNOSIS — Z862 Personal history of diseases of the blood and blood-forming organs and certain disorders involving the immune mechanism: Secondary | ICD-10-CM | POA: Insufficient documentation

## 2013-05-06 DIAGNOSIS — F3289 Other specified depressive episodes: Secondary | ICD-10-CM | POA: Insufficient documentation

## 2013-05-06 DIAGNOSIS — Z85820 Personal history of malignant melanoma of skin: Secondary | ICD-10-CM | POA: Insufficient documentation

## 2013-05-06 DIAGNOSIS — F419 Anxiety disorder, unspecified: Secondary | ICD-10-CM

## 2013-05-06 DIAGNOSIS — I10 Essential (primary) hypertension: Secondary | ICD-10-CM | POA: Insufficient documentation

## 2013-05-06 LAB — CBC
HCT: 39.9 % (ref 39.0–52.0)
MCH: 34 pg (ref 26.0–34.0)
MCHC: 36.6 g/dL — ABNORMAL HIGH (ref 30.0–36.0)
MCV: 92.8 fL (ref 78.0–100.0)
Platelets: 251 10*3/uL (ref 150–400)
RBC: 4.3 MIL/uL (ref 4.22–5.81)
RDW: 13.4 % (ref 11.5–15.5)
WBC: 9.8 10*3/uL (ref 4.0–10.5)

## 2013-05-06 LAB — BASIC METABOLIC PANEL
BUN: 11 mg/dL (ref 6–23)
Calcium: 9 mg/dL (ref 8.4–10.5)
Chloride: 103 mEq/L (ref 96–112)
Creatinine, Ser: 0.93 mg/dL (ref 0.50–1.35)
GFR calc Af Amer: >90 mL/min (ref >90)
GFR calc non Af Amer: 86 mL/min — ABNORMAL LOW (ref >90)

## 2013-05-06 LAB — POCT I-STAT TROPONIN I: Troponin i, poc: 0 ng/mL (ref 0.00–0.08)

## 2013-05-06 NOTE — ED Notes (Signed)
Presents with SOB and upper quadrant abdominal pain described as tightness. Denies nausea. SOB and tightness began one hour ago. Pt concerned this a side effect of klonopin, which he has been taking for 5 weeks. Bilateral breath sounds clear.

## 2013-05-06 NOTE — ED Provider Notes (Signed)
CSN: [id number]     Arrival date & time 05/06/13  2005 History   First MD Initiated Contact with Patient 05/06/13 2221     Chief Complaint  Patient presents with  . Shortness of Breath   (Consider location/radiation/quality/duration/timing/severity/associated sxs/prior Treatment) Patient is a 67 y.o. male presenting with shortness of breath. The history is provided by the patient.  Shortness of Breath  patient here complaining of shortness of breath at rest associated with anxiety. Seen here recently for similar symptoms and prescribed Ambien to help him sleep. Patient denies any homicidal or suicidal ideations. Denies any anginal type chest pain. No fever or cough recently. Patient's symptoms were worse when he felt anxiety and better when he relaxes. No treatment used prior to arrival.  Past Medical History  Diagnosis Date  . Hypertension   . Depression   . Blood transfusion without reported diagnosis     age 18  . Cancer     melanoma calf and back  . Glaucoma   . Cataract   . Anemia     as child  . Anxiety attack    Past Surgical History  Procedure Laterality Date  . Melanoma excision    . Tonsillectomy      pt unsure  . Colonoscopy    . Polypectomy     Family History  Problem Relation Age of Onset  . Colon cancer Neg Hx   . Rectal cancer Neg Hx   . Stomach cancer Neg Hx    History  Substance Use Topics  . Smoking status: Never Smoker   . Smokeless tobacco: Never Used  . Alcohol Use: Yes     Comment: rare-once every few months maybe    Review of Systems  Respiratory: Positive for shortness of breath.   All other systems reviewed and are negative.    Allergies  Diphenhydramine  Home Medications   Current Outpatient Rx  Name  Route  Sig  Dispense  Refill  . allopurinol (ZYLOPRIM) 100 MG tablet   Oral   Take 100 mg by mouth daily.         Marland Kitchen buPROPion (WELLBUTRIN XL) 300 MG 24 hr tablet   Oral   Take 300 mg by mouth daily.         Marland Kitchen CALCIUM  CITRATE PO   Oral   Take 2 tablets by mouth every morning.         . clonazePAM (KLONOPIN) 0.5 MG tablet   Oral   Take 0.5 mg by mouth 2 (two) times daily.         . Coenzyme Q10 (CO Q-10) 300 MG CAPS   Oral   Take 1 capsule by mouth daily.         . Glucosamine-Chondroit-Vit C-Mn (GLUCOSAMINE 1500 COMPLEX PO)   Oral   Take 1,500 mg by mouth daily.         Marland Kitchen omega-3 acid ethyl esters (LOVAZA) 1 G capsule   Oral   Take 2 g by mouth daily.          Marland Kitchen OVER THE COUNTER MEDICATION   Oral   Take 120 mg by mouth daily. Ginko Biloba 120 mg         . OVER THE COUNTER MEDICATION   Oral   Take 8 tablets by mouth daily. [employer].         . travoprost, benzalkonium, (TRAVATAN) 0.004 % ophthalmic solution   Both Eyes   Place 1  drop into both eyes at bedtime.         . valsartan (DIOVAN) 80 MG tablet   Oral   Take 80 mg by mouth daily.         Marland Kitchen zolpidem (AMBIEN) 5 MG tablet   Oral   Take 1 tablet (5 mg total) by mouth at bedtime as needed for sleep.   5 tablet   0    BP 147/84  Pulse 82  Temp(Src) 98.7 F (37.1 C) (Oral)  Resp 18  SpO2 98% Physical Exam  Nursing note and vitals reviewed. Constitutional: He is oriented to person, place, and time. He appears well-developed and well-nourished.  Non-toxic appearance. No distress.  HENT:  Head: Normocephalic and atraumatic.  Eyes: Conjunctivae, EOM and lids are normal. Pupils are equal, round, and reactive to light.  Neck: Normal range of motion. Neck supple. No tracheal deviation present. No mass present.  Cardiovascular: Normal rate, regular rhythm and normal heart sounds.  Exam reveals no gallop.   No murmur heard. Pulmonary/Chest: Effort normal and breath sounds normal. No stridor. No respiratory distress. He has no decreased breath sounds. He has no wheezes. He has no rhonchi. He has no rales.  Abdominal: Soft. Normal appearance and bowel sounds are normal. He exhibits no  distension. There is no tenderness. There is no rebound and no CVA tenderness.  Musculoskeletal: Normal range of motion. He exhibits no edema and no tenderness.  Neurological: He is alert and oriented to person, place, and time. He has normal strength. No cranial nerve deficit or sensory deficit. GCS eye subscore is 4. GCS verbal subscore is 5. GCS motor subscore is 6.  Skin: Skin is warm and dry. No abrasion and no rash noted.  Psychiatric: He has a normal mood and affect. His speech is normal and behavior is normal. He expresses no homicidal and no suicidal ideation.    ED Course  Procedures (including critical care time) Labs Review Labs Reviewed  CBC - Abnormal; Notable for the following:    MCHC 36.6 (*)    All other components within normal limits  BASIC METABOLIC PANEL - Abnormal; Notable for the following:    Glucose, Bld 107 (*)    GFR calc non Af Amer 86 (*)    All other components within normal limits  POCT I-STAT TROPONIN I   Imaging Review Dg Chest 2 View  05/06/2013   CLINICAL DATA:  Shortness of breath, abdominal distention  EXAM: CHEST  2 VIEW  COMPARISON:  04/07/2012  FINDINGS: Lungs are clear. No pleural effusion or pneumothorax.  The heart is normal in size.  Degenerative changes of the visualized thoracolumbar spine.  IMPRESSION: No evidence of acute cardiopulmonary disease.   Electronically Signed   By: Charline Bills M.D.   On: 05/06/2013 20:48    MDM  No diagnosis found.  Date: 05/06/2013  Rate: 81  Rhythm: normal sinus rhythm  QRS Axis: normal  Intervals: normal  ST/T Wave abnormalities: nonspecific ST changes  Conduction Disutrbances:none  Narrative Interpretation:   Old EKG Reviewed: none available  Spoke with patient and his wife at length. Patient admits that he is more anxious. States he exercises 2 hours a day and has not had any exercise intolerance. Wife does admit that the more stress at home. No suicidal or homicidal ideations. Patient has  scheduled followup with his doctor on Friday for a medication change. He does wife are comfortable going home      Toy Baker,  MD 05/06/13 2255

## 2013-05-21 ENCOUNTER — Emergency Department (HOSPITAL_COMMUNITY)
Admission: EM | Admit: 2013-05-21 | Discharge: 2013-05-21 | Disposition: A | Discharge status: Home / Self Care | Payer: Medicare Other | Attending: Emergency Medicine | Admitting: Emergency Medicine

## 2013-05-21 ENCOUNTER — Encounter (HOSPITAL_COMMUNITY): Payer: Self-pay | Admitting: Emergency Medicine

## 2013-05-21 DIAGNOSIS — F418 Other specified anxiety disorders: Secondary | ICD-10-CM

## 2013-05-21 DIAGNOSIS — H409 Unspecified glaucoma: Secondary | ICD-10-CM | POA: Insufficient documentation

## 2013-05-21 DIAGNOSIS — Z79899 Other long term (current) drug therapy: Secondary | ICD-10-CM | POA: Insufficient documentation

## 2013-05-21 DIAGNOSIS — I10 Essential (primary) hypertension: Secondary | ICD-10-CM | POA: Insufficient documentation

## 2013-05-21 DIAGNOSIS — F3289 Other specified depressive episodes: Secondary | ICD-10-CM | POA: Insufficient documentation

## 2013-05-21 DIAGNOSIS — F332 Major depressive disorder, recurrent severe without psychotic features: Secondary | ICD-10-CM

## 2013-05-21 DIAGNOSIS — R630 Anorexia: Secondary | ICD-10-CM | POA: Insufficient documentation

## 2013-05-21 DIAGNOSIS — Z862 Personal history of diseases of the blood and blood-forming organs and certain disorders involving the immune mechanism: Secondary | ICD-10-CM | POA: Insufficient documentation

## 2013-05-21 DIAGNOSIS — Z85820 Personal history of malignant melanoma of skin: Secondary | ICD-10-CM | POA: Insufficient documentation

## 2013-05-21 DIAGNOSIS — F329 Major depressive disorder, single episode, unspecified: Secondary | ICD-10-CM

## 2013-05-21 DIAGNOSIS — G479 Sleep disorder, unspecified: Secondary | ICD-10-CM | POA: Insufficient documentation

## 2013-05-21 DIAGNOSIS — F411 Generalized anxiety disorder: Secondary | ICD-10-CM | POA: Insufficient documentation

## 2013-05-21 LAB — URINALYSIS, ROUTINE W REFLEX MICROSCOPIC
Bilirubin Urine: NEGATIVE
Glucose, UA: NEGATIVE mg/dL
Ketones, ur: NEGATIVE mg/dL
Leukocytes, UA: NEGATIVE
Nitrite: NEGATIVE
Specific Gravity, Urine: 1.02 (ref 1.005–1.030)
pH: 6 (ref 5.0–8.0)

## 2013-05-21 LAB — RAPID URINE DRUG SCREEN, HOSP PERFORMED
Amphetamines: NOT DETECTED
Barbiturates: NOT DETECTED
Benzodiazepines: NOT DETECTED
Cocaine: NOT DETECTED
Opiates: NOT DETECTED
Tetrahydrocannabinol: NOT DETECTED

## 2013-05-21 LAB — COMPREHENSIVE METABOLIC PANEL
ALT: 19 U/L (ref 0–53)
Albumin: 4.1 g/dL (ref 3.5–5.2)
Alkaline Phosphatase: 73 U/L (ref 39–117)
BUN: 11 mg/dL (ref 6–23)
Calcium: 9.1 mg/dL (ref 8.4–10.5)
GFR calc Af Amer: >90 mL/min (ref >90)
Glucose, Bld: 89 mg/dL (ref 70–99)
Potassium: 3.8 mEq/L (ref 3.5–5.1)
Sodium: 141 mEq/L (ref 135–145)
Total Protein: 7 g/dL (ref 6.0–8.3)

## 2013-05-21 LAB — CBC WITH DIFFERENTIAL/PLATELET
Basophils Absolute: 0 10*3/uL (ref 0.0–0.1)
Basophils Relative: 0 % (ref 0–1)
Eosinophils Absolute: 0.1 10*3/uL (ref 0.0–0.7)
Eosinophils Relative: 1 % (ref 0–5)
Hemoglobin: 14.7 g/dL (ref 13.0–17.0)
MCH: 34.4 pg — ABNORMAL HIGH (ref 26.0–34.0)
MCHC: 36.8 g/dL — ABNORMAL HIGH (ref 30.0–36.0)
MCV: 93.7 fL (ref 78.0–100.0)
Neutrophils Relative %: 56 % (ref 43–77)
Platelets: 267 10*3/uL (ref 150–400)
RDW: 13.3 % (ref 11.5–15.5)

## 2013-05-21 MED ORDER — ACETAMINOPHEN 325 MG PO TABS: 650.0000 mg | ORAL_TABLET | ORAL | Status: DC | PRN

## 2013-05-21 MED ORDER — ALUM & MAG HYDROXIDE-SIMETH 200-200-20 MG/5ML PO SUSP: 30.0000 mL | ORAL | Status: DC | PRN

## 2013-05-21 MED ORDER — ALLOPURINOL 100 MG PO TABS
100.0000 mg | ORAL_TABLET | Freq: Every day | ORAL | Status: DC
  Administered 2013-05-21: 100 mg via ORAL
  Filled 2013-05-21: qty 1

## 2013-05-21 MED ORDER — OMEGA-3-ACID ETHYL ESTERS 1 G PO CAPS
2.0000 g | ORAL_CAPSULE | Freq: Every day | ORAL | Status: DC
  Administered 2013-05-21: 2 g via ORAL
  Filled 2013-05-21: qty 2

## 2013-05-21 MED ORDER — CLONAZEPAM 0.5 MG PO TABS
0.5000 mg | ORAL_TABLET | Freq: Every day | ORAL | Status: DC
  Filled 2013-05-21: qty 1

## 2013-05-21 MED ORDER — IBUPROFEN 400 MG PO TABS: 600.0000 mg | ORAL_TABLET | Freq: Three times a day (TID) | ORAL | Status: DC | PRN

## 2013-05-21 MED ORDER — IRBESARTAN 75 MG PO TABS
75.0000 mg | ORAL_TABLET | Freq: Every day | ORAL | Status: DC
  Administered 2013-05-21: 75 mg via ORAL
  Filled 2013-05-21: qty 1

## 2013-05-21 MED ORDER — LORAZEPAM 1 MG PO TABS: 1.0000 mg | ORAL_TABLET | Freq: Three times a day (TID) | ORAL | Status: DC | PRN

## 2013-05-21 MED ORDER — CLONAZEPAM 0.5 MG PO TABS: 0.5000 mg | ORAL_TABLET | Freq: Every day | ORAL | Status: DC

## 2013-05-21 MED ORDER — ONDANSETRON HCL 4 MG PO TABS: 4.0000 mg | ORAL_TABLET | Freq: Three times a day (TID) | ORAL | Status: DC | PRN

## 2013-05-21 MED ORDER — NICOTINE 21 MG/24HR TD PT24
21.0000 mg | MEDICATED_PATCH | Freq: Every day | TRANSDERMAL | Status: DC
  Filled 2013-05-21: qty 1

## 2013-05-21 MED ORDER — ZOLPIDEM TARTRATE 5 MG PO TABS: 5.0000 mg | ORAL_TABLET | Freq: Every evening | ORAL | Status: DC | PRN

## 2013-05-21 MED ORDER — BUPROPION HCL ER (XL) 300 MG PO TB24
300.0000 mg | ORAL_TABLET | Freq: Every day | ORAL | Status: DC
  Administered 2013-05-21: 300 mg via ORAL
  Filled 2013-05-21: qty 1

## 2013-05-21 NOTE — ED Provider Notes (Signed)
Pt directly evaluated by Psychiatry.  Disposition arranged.  Roney Marion, MD 05/21/13 [phone]

## 2013-05-21 NOTE — ED Notes (Signed)
Spoke to [facility] with [facility] states plan is for intensive outpatient therapy and is waiting for psych provider to convey information and plan and schedule telepsych.

## 2013-05-21 NOTE — ED Notes (Signed)
Patient denies pain and is resting comfortably. Eating breakfast, no needs voiced.

## 2013-05-21 NOTE — Consult Note (Signed)
Telepsych Consultation   Reason for Consult:  Depression with anxiety Referring Physician:   [name] is an 67 y.o. male.  Assessment: AXIS I:  Major Depression, Recurrent severe without psychotic features AXIS II:  Deferred AXIS III:   Past Medical History  Diagnosis Date  . Hypertension   . Depression   . Blood transfusion without reported diagnosis     age 5  . Cancer     melanoma calf and back  . Glaucoma   . Cataract   . Anemia     as child  . Anxiety attack    AXIS IV:  housing problems, occupational problems and other psychosocial or environmental problems AXIS V:  51-60 moderate symptoms  Plan:  No evidence of imminent risk to self or others at present.   Patient does not meet criteria for psychiatric inpatient admission. Supportive therapy provided about ongoing stressors. Refer to IOP.  Subjective:   Brian Maxwell is a 67 y.o. male patient admitted with increased depression and anxiety. Patient states "I've been having hard time adjusting to retirement. 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 I got really stressed because of my reduced income and became very anxious. I have never been suicidal and would not entertain the idea. Never had an attempt. My wife and I have worked out a way to cope financially. I would devote myself to IOP if given the chance. I am feeling less depressed and anxious because now I know what kind of help I need."   HPI:  Brian Maxwell is an 67 y.o. male. He presents to [facility] with worsening depression. Pt was brought to [facility] by his spouse. He has a long history depression starting in 1968 but the last few months have worsened. He is currently very polite, calm, cooperative. His stress has escalated with related symptoms of not sleeping well and loss of appetite. Pt sleeps no more than 4 hours per night and he is requesting meds to help with this issue. His appetite is poor loosing 10  pounds in 1 week. He also has loss of interest in usual pleasures and feels hopeless. 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 He is also concerned about the pattern of his depression. Says that he feels extremely depressed every other day and days in between fairly depressed. Pt would like medications evaluated as he feels this pattern is related to his medications. He denies SI (no history). He denies HI (no history). No AVH's (no history). Patient does not use alcohol or drugs. He has no history of inpatient treatment. He does have outpatient providers Dr. Charlette Caffey (psychiatrist) with [facility] and Barbie Haggis (therapist).   HPI Elements:   Location:  [facility]. Quality:  Increased depression and anxiety. Severity:  Patient came to the hospital to seek help with symptoms. . Timing:  Last four months. Duration:  Since start of the year. . Context:  Financial problems, retirement, feeling more isolated .  Past Psychiatric History: Past Medical History  Diagnosis Date  . Hypertension   . Depression   . Blood transfusion without reported diagnosis     age 19  . Cancer     melanoma calf  and back  . Glaucoma   . Cataract   . Anemia     as child  . Anxiety attack     reports that he has never smoked. He has never used smokeless tobacco. He reports that he drinks alcohol. He reports that he does not use illicit drugs. Family History  Problem Relation Age of Onset  . Colon cancer Neg Hx   . Rectal cancer Neg Hx   . Stomach cancer Neg Hx    Family History Substance Abuse: No Family Supports: Yes, List: (Spouse) Living Arrangements: Spouse/significant other Can pt return to current living arrangement?: Yes Allergies:   Allergies  Allergen Reactions  .  Diphenhydramine Shortness Of Breath    ACT Assessment Complete:  Yes:    Educational Status    Risk to Self: Risk to self Suicidal Ideation: No Suicidal Intent: No Is patient at risk for suicide?: No Suicidal Plan?: No Access to Means: No What has been your use of drugs/alcohol within the last 12 months?:  (patient denies alcohol and drug use) Previous Attempts/Gestures: No How many times?:  (n/a) Other Self Harm Risks:  (n/a) Triggers for Past Attempts: Other (Comment) Intentional Self Injurious Behavior: None Family Suicide History: No Recent stressful life event(s): Other (Comment) ( left job, moved, started to have $ px's, moved bk, PT worki) Persecutory voices/beliefs?: No Depression: No Depression Symptoms: Feeling angry/irritable;Feeling worthless/self pity;Loss of interest in usual pleasures;Fatigue;Isolating;Tearfulness Substance abuse history and/or treatment for substance abuse?: No Suicide prevention information given to non-admitted patients: Not applicable  Risk to Others: Risk to Others Homicidal Ideation: No Thoughts of Harm to Others: No Current Homicidal Intent: No Current Homicidal Plan: No Access to Homicidal Means: No Identified Victim:  (n/a) History of harm to others?: No Assessment of Violence: None Noted Violent Behavior Description:  (none reported ) Does patient have access to weapons?: No Criminal Charges Pending?: No Does patient have a court date: No  Abuse: Abuse/Neglect Assessment (Assessment to be complete while patient is alone) Physical Abuse: Denies Verbal Abuse: Denies Sexual Abuse: Denies Exploitation of patient/patient's resources: Denies Self-Neglect: Denies  Prior Inpatient Therapy: Prior Inpatient Therapy Prior Inpatient Therapy: No Prior Therapy Dates:  (n/a) Prior Therapy Facilty/Provider(s):  (n/a) Reason for Treatment:  (n/a)  Prior Outpatient Therapy: Prior Outpatient Therapy Prior Outpatient Therapy: No Prior Therapy  Dates:  (n/a) Prior Therapy Facilty/Provider(s):  (n/a) Reason for Treatment:  (n/a)  Additional Information: Additional Information 1:1 In Past 12 Months?: No CIRT Risk: No Elopement Risk: No Does patient have medical clearance?: Yes                  Objective: Blood pressure 131/76, pulse 66, temperature 98.3 F (36.8 C), temperature source Oral, resp. rate 20, height 5\' 10"  (1.778 m), weight 97.722 kg (215 lb 7 oz), SpO2 99.00%.Body mass index is 30.91 kg/(m^2). Results for orders placed during the hospital encounter of 05/21/13 (from the past 72 hour(s))  CBC WITH DIFFERENTIAL     Status: Abnormal   Collection Time    05/21/13  3:45 AM      Result Value Range   WBC 8.1  4.0 - 10.5 K/uL   RBC 4.27  4.22 - 5.81 MIL/uL   Hemoglobin 14.7  13.0 - 17.0 g/dL   HCT 40.9  81.1 - 91.4 %   MCV 93.7  78.0 - 100.0 fL   MCH 34.4 (*) 26.0 - 34.0 pg   MCHC 36.8 (*) 30.0 - 36.0 g/dL   RDW  13.3  11.5 - 15.5 %   Platelets 267  150 - 400 K/uL   Neutrophils Relative % 56  43 - 77 %   Neutro Abs 4.5  1.7 - 7.7 K/uL   Lymphocytes Relative 34  12 - 46 %   Lymphs Abs 2.8  0.7 - 4.0 K/uL   Monocytes Relative 9  3 - 12 %   Monocytes Absolute 0.7  0.1 - 1.0 K/uL   Eosinophils Relative 1  0 - 5 %   Eosinophils Absolute 0.1  0.0 - 0.7 K/uL   Basophils Relative 0  0 - 1 %   Basophils Absolute 0.0  0.0 - 0.1 K/uL  COMPREHENSIVE METABOLIC PANEL     Status: Abnormal   Collection Time    05/21/13  3:45 AM      Result Value Range   Sodium 141  135 - 145 mEq/L   Potassium 3.8  3.5 - 5.1 mEq/L   Chloride 105  96 - 112 mEq/L   CO2 22  19 - 32 mEq/L   Glucose, Bld 89  70 - 99 mg/dL   BUN 11  6 - 23 mg/dL   Creatinine, Ser 7.82  0.50 - 1.35 mg/dL   Calcium 9.1  8.4 - 95.6 mg/dL   Total Protein 7.0  6.0 - 8.3 g/dL   Albumin 4.1  3.5 - 5.2 g/dL   AST 25  0 - 37 U/L   ALT 19  0 - 53 U/L   Alkaline Phosphatase 73  39 - 117 U/L   Total Bilirubin 0.6  0.3 - 1.2 mg/dL   GFR calc non Af Amer  85 (*) >90 mL/min   GFR calc Af Amer >90  >90 mL/min   Comment: (NOTE)     The eGFR has been calculated using the CKD EPI equation.     This calculation has not been validated in all clinical situations.     eGFR's persistently <90 mL/min signify possible Chronic Kidney     Disease.  ETHANOL     Status: None   Collection Time    05/21/13  3:45 AM      Result Value Range   Alcohol, Ethyl (B) <11  0 - 11 mg/dL   Comment:            LOWEST DETECTABLE LIMIT FOR     SERUM ALCOHOL IS 11 mg/dL     FOR MEDICAL PURPOSES ONLY  URINALYSIS, ROUTINE W REFLEX MICROSCOPIC     Status: None   Collection Time    05/21/13  4:52 AM      Result Value Range   Color, Urine YELLOW  YELLOW   APPearance CLEAR  CLEAR   Specific Gravity, Urine 1.020  1.005 - 1.030   pH 6.0  5.0 - 8.0   Glucose, UA NEGATIVE  NEGATIVE mg/dL   Hgb urine dipstick NEGATIVE  NEGATIVE   Bilirubin Urine NEGATIVE  NEGATIVE   Ketones, ur NEGATIVE  NEGATIVE mg/dL   Protein, ur NEGATIVE  NEGATIVE mg/dL   Urobilinogen, UA 0.2  0.0 - 1.0 mg/dL   Nitrite NEGATIVE  NEGATIVE   Leukocytes, UA NEGATIVE  NEGATIVE   Comment: MICROSCOPIC NOT DONE ON URINES WITH NEGATIVE PROTEIN, BLOOD, LEUKOCYTES, NITRITE, OR GLUCOSE <1000 mg/dL.  URINE RAPID DRUG SCREEN (HOSP PERFORMED)     Status: None   Collection Time    05/21/13  4:52 AM      Result Value Range   Opiates NONE DETECTED  NONE DETECTED   Cocaine NONE DETECTED  NONE DETECTED   Benzodiazepines NONE DETECTED  NONE DETECTED   Amphetamines NONE DETECTED  NONE DETECTED   Tetrahydrocannabinol NONE DETECTED  NONE DETECTED   Barbiturates NONE DETECTED  NONE DETECTED   Comment:            DRUG SCREEN FOR MEDICAL PURPOSES     ONLY.  IF CONFIRMATION IS NEEDED     FOR ANY PURPOSE, NOTIFY LAB     WITHIN 5 DAYS.                LOWEST DETECTABLE LIMITS     FOR URINE DRUG SCREEN     Drug Class       Cutoff (ng/mL)     Amphetamine      1000     Barbiturate      200     Benzodiazepine   200      Tricyclics       300     Opiates          300     Cocaine          300     THC              50   Labs are reviewed and are pertinent for WNL.  Current Facility-Administered Medications  Medication Dose Route Frequency Provider Last Rate Last Dose  . acetaminophen (TYLENOL) tablet 650 mg  650 mg Oral Q4H PRN Lyanne Co, MD      . allopurinol (ZYLOPRIM) tablet 100 mg  100 mg Oral Daily Lyanne Co, MD   100 mg at 05/21/13 1138  . alum & mag hydroxide-simeth (MAALOX/MYLANTA) 200-200-20 MG/5ML suspension 30 mL  30 mL Oral PRN Lyanne Co, MD      . buPROPion (WELLBUTRIN XL) 24 hr tablet 300 mg  300 mg Oral Daily Lyanne Co, MD   300 mg at 05/21/13 1142  . clonazePAM (KLONOPIN) tablet 0.5 mg  0.5 mg Oral Daily Lyanne Co, MD      . ibuprofen (ADVIL,MOTRIN) tablet 600 mg  600 mg Oral Q8H PRN Lyanne Co, MD      . irbesartan (AVAPRO) tablet 75 mg  75 mg Oral Daily Lyanne Co, MD   75 mg at 05/21/13 1138  . LORazepam (ATIVAN) tablet 1 mg  1 mg Oral Q8H PRN Lyanne Co, MD      . nicotine (NICODERM CQ - dosed in mg/24 hours) patch 21 mg  21 mg Transdermal Daily Lyanne Co, MD      . omega-3 acid ethyl esters (LOVAZA) capsule 2 g  2 g Oral Daily Lyanne Co, MD   2 g at 05/21/13 1137  . ondansetron (ZOFRAN) tablet 4 mg  4 mg Oral Q8H PRN Lyanne Co, MD      . zolpidem [facility]) tablet 5 mg  5 mg Oral QHS PRN Lyanne Co, MD       Current Outpatient Prescriptions  Medication Sig Dispense Refill  . allopurinol (ZYLOPRIM) 100 MG tablet Take 100 mg by mouth daily.      Marland Kitchen buPROPion (WELLBUTRIN XL) 300 MG 24 hr tablet Take 300 mg by mouth daily.      Marland Kitchen CALCIUM CITRATE PO Take 2 tablets by mouth every morning.      . clonazePAM (KLONOPIN) 0.5 MG tablet Take 0.5 mg by mouth daily.       Marland Kitchen  Coenzyme Q10 (CO Q-10) 300 MG CAPS Take 1 capsule by mouth daily.      . Glucosamine-Chondroit-Vit C-Mn (GLUCOSAMINE 1500 COMPLEX PO) Take 1,500 mg by mouth daily.      Marland Kitchen  omega-3 acid ethyl esters (LOVAZA) 1 G capsule Take 2 g by mouth daily.       Marland Kitchen OVER THE COUNTER MEDICATION Take 120 mg by mouth daily. Ginko Biloba 120 mg      . OVER THE COUNTER MEDICATION Take 8 tablets by mouth daily. [employer] Performance and Vitality [place].      . travoprost, benzalkonium, (TRAVATAN) 0.004 % ophthalmic solution Place 1 drop into both eyes at bedtime.      . valsartan (DIOVAN) 80 MG tablet Take 80 mg by mouth daily.      Marland Kitchen zolpidem (AMBIEN) 5 MG tablet Take 1 tablet (5 mg total) by mouth at bedtime as needed for sleep.  5 tablet  0    Psychiatric Specialty Exam:     Blood pressure 131/76, pulse 66, temperature 98.3 F (36.8 C), temperature source Oral, resp. rate 20, height 5\' 10"  (1.778 m), weight 97.722 kg (215 lb 7 oz), SpO2 99.00%.Body mass index is 30.91 kg/(m^2).  General Appearance: Casual  Eye Contact::  Good  Speech:  Clear and Coherent  Volume:  Normal  Mood:  Anxious  Affect:  Full Range  Thought Process:  Goal Directed and Intact  Orientation:  Full (Time, Place, and Person)  Thought Content:  WDL  Suicidal Thoughts:  No  Homicidal Thoughts:  No  Memory:  Immediate;   Good Recent;   Good Remote;   Good  Judgement:  Good  Insight:  Present  Psychomotor Activity:  Normal  Concentration:  Good  Recall:  Good  Akathisia:  No  Handed:  Right  AIMS (if indicated):     Assets:  Communication Skills Desire for Improvement Housing Intimacy Leisure Time Physical Health Resilience Social Support Vocational/Educational  Sleep:      Treatment Plan Summary: Discussed case with Dr. Dub Mikes. Patient is not a danger to himself and does not meet criteria for inpatient admission. He is open to [facility] IOP and has the time available to attend. This patient should be referred to [facility] IOP for therapy and further medication management.   Disposition: Disposition Initial Assessment Completed for this Encounter: Yes Disposition of Patient: Other dispositions  (Disposition pending an evaluation by an Extender for dischar) Other disposition(s): Other (Comment) (Pt is possible candidate for Psych IOP if d/c'd from ED)  Eshawn Coor NP-C 05/21/2013 3:01 PM

## 2013-05-21 NOTE — BH Assessment (Signed)
EDP-Dr. Patria Mane [phone] contacted and notified that patient is on our log. He is scheduled to be seen at 0805. Also discussed clinical information as to why this patient needed to be seen.

## 2013-05-21 NOTE — ED Notes (Signed)
Telepsych in progress. 

## 2013-05-21 NOTE — ED Notes (Addendum)
Pt. A&ox4 in NAD. Laying comfortably in bed able to speak full sentences with clear thought process. Pt has pertinent PMH of depression and anxiety and has rx for wellbutrin x 14years and klonopin with anxiety relief. Pt sts he came in this morning because he has the worst episode of depression and anxiety. sts pt is anxious due to voluntarily giving up tenure as professor at [employer] and going into financial difficulty related to house mortgage. Pt sts anxiety spiraled into depression and patient is not able to sleep for periods longer than 4 hours at night, has no appetite and has lost appx 10 pounds, pt sts has energy to survive but is disinterested in normal activities that interest him.  Pt updated on plan of care- waiting for telepsych & tts recommendations. Pt called wife on phone and updated on plan of care.

## 2013-05-21 NOTE — BH Assessment (Signed)
Assessment Note  Brian Maxwell is an 67 y.o. male. He presents to [facility] with worsening depression. Pt was brought to [facility] by his spouse. He has a long history depression starting in 1968 but the last few months have worsened. He is currently very polite, calm, cooperative. His stress has escalated with related symptoms of not sleeping well and loss of appetite. Pt sleeps no more than 4 hours per night and he is requesting meds to help with this issue. His appetite is poor loosing 10 pounds in 1 week.  He also has loss of interest in usual pleasures and feels hopeless. 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 He is also concerned about the pattern of his depression. Says that he feels extremely depressed every other day and days in between fairly depressed. Pt would like medications evaluated as he feels this pattern is related to his medications. He denies SI (no history). He denies HI (no history). No AVH's (no history). Patient does not use alcohol or drugs. He has no history of inpatient treatment. He does have outpatient providers Dr. Charlette Maxwell (psychiatrist) with [facility] and Brian Maxwell (therapist).  Patient denies SI, HI, and AVH. Writer spoke with patient about the Psych IOP. Patient is extremely motivated to improve his mental state and interested in the Psych IOP program. Writer will request a telepsych from an extender to evaluate this patient for discharge home.    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 Hypertension   . Depression   . Blood transfusion without reported diagnosis     age 28  . Cancer     melanoma calf and back  . Glaucoma   .  Cataract   . Anemia     as child  . Anxiety attack    Axis IV: economic problems, other psychosocial or environmental problems and problems related to social environment Axis V: 41-50 serious symptoms  Past Medical History:  Past Medical History  Diagnosis Date  . Hypertension   . Depression   . Blood transfusion without reported diagnosis     age 4  . Cancer     melanoma calf and back  . Glaucoma   . Cataract   . Anemia     as child  . Anxiety attack     Past Surgical History  Procedure Laterality Date  . Melanoma excision    . Tonsillectomy      pt unsure  . Colonoscopy    . Polypectomy      Family History:  Family History  Problem Relation Age of Onset  . Colon cancer Neg Hx   . Rectal cancer Neg Hx   . Stomach cancer Neg Hx     Social History:  reports that he has never smoked. He has never used smokeless tobacco. He reports that he drinks alcohol. He reports that he does not use illicit drugs.  Additional Social History:  Alcohol / Drug Use Pain Medications: SEE MAR Prescriptions: SEE MAR Over the Counter: SEE MAR History of alcohol / drug use?: No history of alcohol / drug abuse  CIWA: CIWA-Ar BP: 118/76 mmHg Pulse Rate: 69 COWS:  Allergies:  Allergies  Allergen Reactions  . Diphenhydramine Shortness Of Breath    Home Medications:  (Not in a hospital admission)  OB/GYN Status:  No LMP for male patient.  General Assessment Data Location of Assessment: [facility] Is this a Tele or Face-to-Face Assessment?: Tele Assessment Is this an Initial Assessment or a Re-assessment for this encounter?: Initial Assessment Living Arrangements: Spouse/significant other Can pt return to current living arrangement?: Yes Admission Status: Voluntary Is patient capable of signing voluntary admission?: Yes Transfer from: Acute Hospital Referral Source: Self/Family/Friend     [facility] Crisis Care Plan Living Arrangements: Spouse/significant other Name of  Psychiatrist:  (Pt's psychiatrist is Brian Maxwell with [facility]) Name of Therapist:  (Pt's therapist is Brian Maxwell with [facility])  Education Status Is patient currently in school?: No Current Grade:  (n/a) Highest grade of school patient has completed:  (n/a) Name of school:  (n/a) Contact person:  (n/a)  Risk to self Suicidal Ideation: No Suicidal Intent: No Is patient at risk for suicide?: No Suicidal Plan?: No Access to Means: No What has been your use of drugs/alcohol within the last 12 months?:  (patient denies alcohol and drug use) Previous Attempts/Gestures: No How many times?:  (n/a) Other Self Harm Risks:  (n/a) Triggers for Past Attempts: Other (Comment) Intentional Self Injurious Behavior: None Family Suicide History: No Recent stressful life event(s): Other (Comment) ( left job, moved, started to have $ px's, moved bk, PT worki) Persecutory voices/beliefs?: No Depression: No Depression Symptoms: Feeling angry/irritable;Feeling worthless/self pity;Loss of interest in usual pleasures;Fatigue;Isolating;Tearfulness Substance abuse history and/or treatment for substance abuse?: No Suicide prevention information given to non-admitted patients: Not applicable  Risk to Others Homicidal Ideation: No Thoughts of Harm to Others: No Current Homicidal Intent: No Current Homicidal Plan: No Access to Homicidal Means: No Identified Victim:  (n/a) History of harm to others?: No Assessment of Violence: None Noted Violent Behavior Description:  (none reported ) Does patient have access to weapons?: No Criminal Charges Pending?: No Does patient have a court date: No  Psychosis Hallucinations: None noted Delusions: None noted  Mental Status Report Appear/Hygiene: Other (Comment) (appropriate) Eye Contact: Good Motor Activity: Freedom of movement Speech: Logical/coherent Level of Consciousness: Alert Mood: Depressed Affect: Appropriate to  circumstance Anxiety Level: None Thought Processes: Coherent Judgement: Unimpaired Orientation: Person;Place;Time;Situation Obsessive Compulsive Thoughts/Behaviors: None  Cognitive Functioning Concentration: Decreased Memory: Recent Intact;Remote Intact IQ: Average Insight: Fair Impulse Control: Fair Appetite: Poor Weight Loss:  (10 pounds in the past 6 weeks ) Weight Gain:  (none reported) Sleep: Decreased Total Hours of Sleep:  (no more)  ADLScreening [facility] Assessment Services) Patient's cognitive ability adequate to safely complete daily activities?: Yes Patient able to express need for assistance with ADLs?: Yes Independently performs ADLs?: No  Prior Inpatient Therapy Prior Inpatient Therapy: No Prior Therapy Dates:  (n/a) Prior Therapy Facilty/Provider(s):  (n/a) Reason for Treatment:  (n/a)  Prior Outpatient Therapy Prior Outpatient Therapy: No Prior Therapy Dates:  (n/a) Prior Therapy Facilty/Provider(s):  (n/a) Reason for Treatment:  (n/a)  ADL Screening (condition at time of admission) Patient's cognitive ability adequate to safely complete daily activities?: Yes Is the patient deaf or have difficulty hearing?: No Does the patient have difficulty seeing, even when wearing glasses/contacts?: No Does the patient have difficulty concentrating, remembering, or making decisions?: No Patient able to express need for assistance with ADLs?: Yes Does the patient have difficulty dressing or bathing?: No Independently performs ADLs?: No Communication: Independent Dressing (OT): Independent Grooming: Independent  Feeding: Independent Bathing: Independent Toileting: Independent In/Out Bed: Independent Walks in Home: Independent Does the patient have difficulty walking or climbing stairs?: No Weakness of Legs: None Weakness of Arms/Hands: None  Home Assistive Devices/Equipment Home Assistive Devices/Equipment: None  Therapy Consults (therapy consults require a  physician order) PT Evaluation Needed: No OT Evalulation Needed: No SLP Evaluation Needed: No Abuse/Neglect Assessment (Assessment to be complete while patient is alone) Physical Abuse: Denies Verbal Abuse: Denies Sexual Abuse: Denies Exploitation of patient/patient's resources: Denies Self-Neglect: Denies Values / Beliefs Cultural Requests During Hospitalization: None Spiritual Requests During Hospitalization: None Consults Spiritual Care Consult Needed: No Social Work Consult Needed: No Merchant navy officer (For Healthcare) Advance Directive: Patient does not have advance directive Nutrition Screen- [facility] Patient's home diet: Regular  Additional Information 1:1 In Past 12 Months?: No CIRT Risk: No Elopement Risk: No Does patient have medical clearance?: Yes     Disposition:  Disposition Initial Assessment Completed for this Encounter: Yes Disposition of Patient: Other dispositions (Disposition pending an evaluation by an Extender for dischar) Other disposition(s): Other (Comment) (Pt is possible candidate for Psych IOP if d/c'd from ED)  On Site Evaluation by:   Reviewed with Physician:    Melynda Ripple [facility] 05/21/2013 11:48 AM

## 2013-05-21 NOTE — ED Notes (Signed)
Pt states he has been treated for depression since 1968 but has "erupted " this past summer.  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  Has been seeing Plavsky with [facility] for the past 2 months.  Denies ilicit drug use, denies ETOH, denis SI or HI.  States no physical pain.  Requesting help with depression which is "sky-rocketing.  Also states he is unable to sleep despite ambien and klonipin.  Has not had an 8 hr sleep in one month.  Appetite is poor and has lost 10 lbs this past month.

## 2013-05-21 NOTE — BH Assessment (Signed)
Writer has made several calls to the adult unit starting at 0900 to request an extender to see this patient. All extenders have been busy seeing patient's on the adult unit. Writer will continue trying to find an extender to evaluate this patient for possible discharge home and to follow up with Psych IOP.   Writer has notified Delorise Jackson of this delay. Writer has tried to call Dr.Kumar to see if she is available to see this patient.   Patient will continue to remain in the ED until a provider on the unit is able to see this patient.

## 2013-05-21 NOTE — ED Notes (Signed)
Pt. reports feeling depressed for past several days , pt. stated history of depression , denies suicidal ideation .

## 2013-05-21 NOTE — Consult Note (Signed)
Agree with assessment and plan Reynald Woods A. Alessander Sikorski, M.D. 

## 2013-05-21 NOTE — ED Provider Notes (Signed)
CSN: [id number]     Arrival date & time 05/21/13  0208 History   First MD Initiated Contact with Patient 05/21/13 0326     Chief Complaint  Patient presents with  . Depression   (Consider location/radiation/quality/duration/timing/severity/associated sxs/prior Treatment) HPI Patient has history of anxiety and depression. Patient states his depression has worsened over the last few days. States he is having trouble sleeping, has anorexia and a depressed mood. The patient denies having thoughts of suicide or homicide. He is on Wellbutrin for depression. He is followed as an outpatient by psychiatrist. He also has been started on Klonopin and Ambien for sleep. Patient denies any hallucinations. He denies any shortness of breath or chest pain. Past Medical History  Diagnosis Date  . Hypertension   . Depression   . Blood transfusion without reported diagnosis     age 67  . Cancer     melanoma calf and back  . Glaucoma   . Cataract   . Anemia     as child  . Anxiety attack    Past Surgical History  Procedure Laterality Date  . Melanoma excision    . Tonsillectomy      pt unsure  . Colonoscopy    . Polypectomy     Family History  Problem Relation Age of Onset  . Colon cancer Neg Hx   . Rectal cancer Neg Hx   . Stomach cancer Neg Hx    History  Substance Use Topics  . Smoking status: Never Smoker   . Smokeless tobacco: Never Used  . Alcohol Use: Yes     Comment: rare-once every few months maybe    Review of Systems  Constitutional: Negative for fever and chills.  Respiratory: Negative for cough and shortness of breath.   Cardiovascular: Negative for chest pain.  Gastrointestinal: Negative for nausea, vomiting, abdominal pain and diarrhea.  Genitourinary: Negative for dysuria and flank pain.  Musculoskeletal: Negative for back pain and myalgias.  Skin: Negative for rash and wound.  Neurological: Negative for dizziness, weakness, light-headedness and numbness.   Psychiatric/Behavioral: Positive for sleep disturbance and dysphoric mood. Negative for suicidal ideas and self-injury. The patient is nervous/anxious.   All other systems reviewed and are negative.    Allergies  Diphenhydramine  Home Medications   Current Outpatient Rx  Name  Route  Sig  Dispense  Refill  . allopurinol (ZYLOPRIM) 100 MG tablet   Oral   Take 100 mg by mouth daily.         Marland Kitchen buPROPion (WELLBUTRIN XL) 300 MG 24 hr tablet   Oral   Take 300 mg by mouth daily.         Marland Kitchen CALCIUM CITRATE PO   Oral   Take 2 tablets by mouth every morning.         . clonazePAM (KLONOPIN) 0.5 MG tablet   Oral   Take 0.5 mg by mouth daily.          . Coenzyme Q10 (CO Q-10) 300 MG CAPS   Oral   Take 1 capsule by mouth daily.         . Glucosamine-Chondroit-Vit C-Mn (GLUCOSAMINE 1500 COMPLEX PO)   Oral   Take 1,500 mg by mouth daily.         Marland Kitchen omega-3 acid ethyl esters (LOVAZA) 1 G capsule   Oral   Take 2 g by mouth daily.          Marland Kitchen OVER THE COUNTER MEDICATION   Oral  Take 120 mg by mouth daily. Ginko Biloba 120 mg         . OVER THE COUNTER MEDICATION   Oral   Take 8 tablets by mouth daily. [employer] Performance and Vitality Vitapak.         . travoprost, benzalkonium, (TRAVATAN) 0.004 % ophthalmic solution   Both Eyes   Place 1 drop into both eyes at bedtime.         . valsartan (DIOVAN) 80 MG tablet   Oral   Take 80 mg by mouth daily.         Marland Kitchen zolpidem (AMBIEN) 5 MG tablet   Oral   Take 1 tablet (5 mg total) by mouth at bedtime as needed for sleep.   5 tablet   0    BP 129/86  Pulse 75  Temp(Src) 98.2 F (36.8 C) (Oral)  Resp 16  Ht 5\' 10"  (1.778 m)  Wt 215 lb 7 oz (97.722 kg)  BMI 30.91 kg/m2  SpO2 95% Physical Exam  Nursing note and vitals reviewed. Constitutional: He is oriented to person, place, and time. He appears well-developed and well-nourished. No distress.  HENT:  Head: Normocephalic and atraumatic.  Mouth/Throat:  Oropharynx is clear and moist.  Eyes: EOM are normal. Pupils are equal, round, and reactive to light.  Neck: Normal range of motion. Neck supple. No thyromegaly present.  Cardiovascular: Normal rate and regular rhythm.   Pulmonary/Chest: Effort normal and breath sounds normal. No respiratory distress. He has no wheezes. He has no rales.  Abdominal: Soft. Bowel sounds are normal. He exhibits no distension and no mass. There is no tenderness. There is no rebound and no guarding.  Musculoskeletal: Normal range of motion. He exhibits no edema and no tenderness.  Neurological: He is alert and oriented to person, place, and time.  Patient is alert and oriented x3 with clear, goal oriented speech. Patient has 5/5 motor in all extremities. Sensation is intact to light touch.   Skin: Skin is warm and dry. No rash noted. No erythema.  Psychiatric:  Anxious appearing    ED Course  Procedures (including critical care time) Labs Review Labs Reviewed  CBC WITH DIFFERENTIAL  COMPREHENSIVE METABOLIC PANEL  URINALYSIS, ROUTINE W REFLEX MICROSCOPIC  ETHANOL  URINE RAPID DRUG SCREEN (HOSP PERFORMED)   Imaging Review No results found.  EKG Interpretation   None       MDM   Patient is medically cleared. We'll ask T TS to see patient for a psychiatric evaluation and possible medication adjustment. I don't believe the patient needs inpatient treatment at this time. He is not a acute risk to himself or others.  Loren Racer, MD 05/23/13 1534

## 2013-05-21 NOTE — ED Notes (Signed)
Spoke with Tom- tts and updated on pt.s dispo.

## 2013-05-21 NOTE — ED Notes (Signed)
Telepsych going.

## 2013-05-21 NOTE — ED Notes (Signed)
Attempted to call TTS for update.

## 2013-05-21 NOTE — ED Notes (Signed)
Pt updated on plan- pt tearful with excitement sts "I really think this is going to help me get out of this and I am confident this intensive outpatient therapy will help me and I can still go home and see my family and exercise to get my mind right."

## 2013-05-21 NOTE — BH Assessment (Signed)
[facility] Assessment Progress Note  Spoke to pt's nurse, Lanora Manis, regarding psychiatric disposition.  Per Fransisca Kaufmann, RN, pt is to be referred to MH-IOP upon discharge from the ED.  I faxed program information to [facility], fax # [phone], to forward to pt.  I advised her to have pt call Jeri Modena at [phone] at any time to schedule a start date for the program.  Creswell Sink will also be able to answer any questions pt may have about the program.  Doylene Canning, MA Triage Specialist 05/21/2013 @ 17:01

## 2013-05-26 ENCOUNTER — Encounter (HOSPITAL_COMMUNITY): Payer: Self-pay

## 2013-05-26 ENCOUNTER — Other Ambulatory Visit (HOSPITAL_COMMUNITY): Payer: Medicare Other | Attending: Psychiatry | Admitting: Psychiatry

## 2013-05-26 DIAGNOSIS — F418 Other specified anxiety disorders: Secondary | ICD-10-CM

## 2013-05-26 DIAGNOSIS — F411 Generalized anxiety disorder: Secondary | ICD-10-CM | POA: Insufficient documentation

## 2013-05-26 DIAGNOSIS — F331 Major depressive disorder, recurrent, moderate: Secondary | ICD-10-CM

## 2013-05-26 DIAGNOSIS — F332 Major depressive disorder, recurrent severe without psychotic features: Secondary | ICD-10-CM | POA: Insufficient documentation

## 2013-05-26 DIAGNOSIS — F410 Panic disorder [episodic paroxysmal anxiety] without agoraphobia: Secondary | ICD-10-CM

## 2013-05-26 MED ORDER — MIRTAZAPINE 15 MG PO TABS: 7.5000 mg | ORAL_TABLET | Freq: Every day | ORAL | Status: DC
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
## 2013-05-26 NOTE — Progress Notes (Signed)
Psychiatric Assessment Adult  Patient Identification:  Brian Maxwell Date of Evaluation:  05/26/2013 Chief Complaint: Depression and anxiety.  History of Chief Complaint:  67 year old white married male father of 2 kids patient is on his second marriage and states the marriage is good presented to the ED with increasing depression and anxiety. He was referred to IOP and starts here today. Patient states that he's been having panic attacks followed by depression and this has been a pattern that has been going on for sometime now. He is a Haematologist at [employer] but because of his anxiety and panic attacks patient has resigned his job and has taken retirement. Patient states that she's had multiple stressors since summer which include their and inability to sell the old house and also having taken out of alone to do repairs on this house. His panic attacks which resulted in him resigned from his job and his inability to be sexually active because he could not obtain an erection. [facility] and also sees a psychologist Dr. Jen Mow. He states Dr. Erling Cruz started him on Klonopin which has helped but he does not like to be on the Klonopin and is fearful of becoming addicted. Reports his marriage is good  Stage manager Complaint  Patient presents with  . Panic Attack  . Depression  . Stress    HPI Review of Systems Physical Exam  Depressive Symptoms: depressed mood, anhedonia, insomnia, psychomotor retardation, fatigue, feelings of worthlessness/guilt, difficulty concentrating, anxiety, panic attacks, decreased appetite,  (Hypo) Manic Symptoms:  None Anxiety Symptoms: Excessive Worry:  Yes Panic Symptoms:  Yes Agoraphobia:  No Obsessive Compulsive: No  Symptoms: None, Specific Phobias:  No Social Anxiety:  No  Psychotic Symptoms:  Hallucinations: No None Delusions:  No Paranoia:  No   Ideas of Reference:   No  PTSD Symptoms: none   Traumatic Brain Injury: No   Past Psychiatric History: Diagnosis: depression in 1968. Depression and anxiety since summer of 2014   Hospitalizations:   Outpatient Care: Dr. Alyse Low for medications and Dr. [facility]ow [facility]   Substance Abuse Care:   Self-Mutilation:   Suicidal Attempts:   Violent Behaviors:    Past Medical History:   Past Medical History  Diagnosis Date  . Hypertension   . Depression   . Blood transfusion without reported diagnosis     age 85  . Cancer     melanoma calf and back  . Glaucoma   . Cataract   . Anemia     as child  . Anxiety attack    History of Loss of Consciousness:  No Seizure History:  No Cardiac History:  No Allergies:   Allergies  Allergen Reactions  . Diphenhydramine Shortness Of Breath   Current Medications:  Current Outpatient Prescriptions  Medication Sig Dispense Refill  . allopurinol (ZYLOPRIM) 100 MG tablet Take 100 mg by mouth daily.      Marland Kitchen buPROPion (WELLBUTRIN XL) 300 MG 24 hr tablet Take 300 mg by mouth daily.      Marland Kitchen CALCIUM CITRATE PO Take 2 tablets by mouth every morning.      . clonazePAM (KLONOPIN) 0.5 MG tablet Take 0.5 mg by mouth daily.       . Coenzyme Q10 (CO Q-10) 300 MG CAPS Take 1 capsule by mouth daily.      . Glucosamine-Chondroit-Vit C-Mn (GLUCOSAMINE 1500 COMPLEX PO) Take 1,500 mg by mouth daily.      Marland Kitchen  omega-3 acid ethyl esters (LOVAZA) 1 G capsule Take 2 g by mouth daily.       Marland Kitchen OVER THE COUNTER MEDICATION Take 120 mg by mouth daily. Ginko Biloba 120 mg      . OVER THE COUNTER MEDICATION Take 8 tablets by mouth daily. [employer] Performance and Vitality Vitapak.      . travoprost, benzalkonium, (TRAVATAN) 0.004 % ophthalmic solution Place 1 drop into both eyes at bedtime.      . valsartan (DIOVAN) 80 MG tablet Take 80 mg by mouth daily.      . mirtazapine (REMERON) 15 MG tablet Take 0.5 tablets (7.5 mg total) by mouth at bedtime.  30 tablet  0   No current  facility-administered medications for this visit.    Previous Psychotropic Medications: 1968 patient does not remember  Medication Dose                          Substance Abuse History in the last 12 months: not applicable Substance Age of 1st Use Last Use Amount Specific Type  Nicotine      Alcohol      Cannabis      Opiates      Cocaine      Methamphetamines      LSD      Ecstasy      Benzodiazepines      Caffeine      Inhalants      Others:                          Medical Consequences of Substance Abuse:   Legal Consequences of Substance Abuse:   Family Consequences of Substance Abuse:   Blackouts:  No DT's:  No Withdrawal Symptoms:  No None  Social History: Current Place of Residence:  Place of Birth:  Family Members:  Marital Status:  Married Children: 2  Sons:   Daughters:  Relationships:  Education:  Corporate treasurer Problems/Performance:  Religious Beliefs/Practices:  History of Abuse: none Teacher, music History:  None. Legal History:  Hobbies/Interests:   Family History:   Family History  Problem Relation Age of Onset  . Colon cancer Neg Hx   . Rectal cancer Neg Hx   . Stomach cancer Neg Hx   . Depression Mother   . Depression Maternal Grandfather     Mental Status Examination/Evaluation: Objective:  Appearance: Well Groomed  Eye Contact::  Good  Speech:  Clear and Coherent  Volume:  Decreased  Mood:  Depressed and anxious   Affect:  Blunt, Constricted and Depressed  Thought Process:  Circumstantial and Goal Directed  Orientation:  Full (Time, Place, and Person)  Thought Content:  Rumination  Suicidal Thoughts:  No  Homicidal Thoughts:  No  Judgement:  Good  Insight:  Good  Psychomotor Activity:  Normal  Akathisia:  No  Handed:  Right  AIMS (if indicated):  0  Assets:  Communication Skills Desire for Improvement Financial Resources/Insurance Housing Intimacy Social  Support Talents/Skills Transportation Vocational/Educational    Laboratory/X-Ray Psychological Evaluation(s)        Assessment:  Axis I: Generalized Anxiety Disorder and Major Depression, Recurrent severe  AXIS I Generalized Anxiety Disorder, Major Depression, Recurrent severe and Panic Disorder  AXIS II Deferred  AXIS III Past Medical History  Diagnosis Date  . Hypertension   . Depression   . Blood transfusion without reported diagnosis     age 37  .  Cancer     melanoma calf and back  . Glaucoma   . Cataract   . Anemia     as child  . Anxiety attack      AXIS IV economic problems and problems related to social environment  AXIS V 51-60 moderate symptoms   Treatment Plan/Recommendations:  Plan of Care: Start IOP   Laboratory:  None at this time  Psychotherapy:  Group therapy  Medications: discussed rationale risks benefits options of Remeron 7.5 mg at bedtime to help his depression patient gave me his informed consent. Patient will start with 3.75 mg of Remeron at bedtime for 4 days and then increase it to 7.5 mg at bedtime. Discontinue Ambien. Continue other medications at the present doses   Routine PRN Medications:  Yes  Consultations:   Safety Concerns:    Other:      Margit Banda, MD 10/20/201412:21 PM

## 2013-05-27 ENCOUNTER — Other Ambulatory Visit (HOSPITAL_COMMUNITY): Payer: Medicare Other | Admitting: Psychiatry

## 2013-05-27 DIAGNOSIS — F418 Other specified anxiety disorders: Secondary | ICD-10-CM

## 2013-05-27 NOTE — Progress Notes (Signed)
    Daily Group Progress Note  Program: IOP  Group Time: 9:00-10:30 am   Participation Level: Active  Behavioral Response: Appropriate  Type of Therapy:  Process Group  Summary of Progress: Today was Pts first day in the group. He was introduced, appeared attentive and observed the group process.      Group Time: 10:30 am - 12:00 pm   Participation Level:  Active  Behavioral Response: Appropriate  Type of Therapy: Psycho-education Group  Summary of Progress: Pt participated in a group on grief and loss and identified current losses impacting wellness and appropriate grieving strategies.   Carman Ching, LCSW

## 2013-05-28 ENCOUNTER — Other Ambulatory Visit (HOSPITAL_COMMUNITY): Payer: Medicare Other | Admitting: Psychiatry

## 2013-05-28 DIAGNOSIS — F418 Other specified anxiety disorders: Secondary | ICD-10-CM

## 2013-05-29 ENCOUNTER — Other Ambulatory Visit (HOSPITAL_COMMUNITY): Payer: Medicare Other | Admitting: Psychiatry

## 2013-05-29 DIAGNOSIS — F418 Other specified anxiety disorders: Secondary | ICD-10-CM

## 2013-05-29 NOTE — Progress Notes (Signed)
    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 He is considering canceling it.      Group Time: 10:30 am - 12:00 pm   Participation Level:  Active  Behavioral Response: Appropriate  Type of Therapy: Psycho-education Group  Summary of Progress: Pt learned about low self-esteem and how it begins and effects mood and behaviors today.   Carman Ching, LCSW

## 2013-05-29 NOTE — Progress Notes (Signed)
    Daily Group Progress Note  Program: IOP  Group Time: 9:00 am -12:00 pm  Participation Level: Active  Behavioral Response: Appropriate  Type of Therapy:  Process Group  Summary of Progress: Pt learned about the symptoms of depression and how the symptoms are present in their daily living and spoke about each symptom they are experiencing.        Naamah Boggess E, LCSW 

## 2013-05-29 NOTE — Progress Notes (Signed)
    Daily Group Progress Note  Program: IOP  Group Time: 9:00 am -12:00 pm  Participation Level: Active  Behavioral Response: Appropriate  Type of Therapy:  Process Group  Summary of Progress: member participated in two goodbye ceremonies for members ending the group and had the opportunity for healthy closure. Pt identified healthy forms of grieving losses.      Wilmina Maxham E, LCSW 

## 2013-05-30 ENCOUNTER — Other Ambulatory Visit (HOSPITAL_COMMUNITY): Payer: Medicare Other | Admitting: Psychiatry

## 2013-05-30 DIAGNOSIS — F418 Other specified anxiety disorders: Secondary | ICD-10-CM

## 2013-05-30 NOTE — Progress Notes (Signed)
Patient ID: Brian Maxwell, male   DOB: [birth year], 67 y.o.   MRN: [id number] Patient viewed an interview today, states he is doing better on the Remeron and is able to sleep well, patient has been participating actively in group giving and receiving feedback is tolerating his medications well. Mood appears to be better and brighter appetite is good denies suicidal or homicidal ideation and has no hallucinations are diffusions. Patient wants to decrease his Klonopin. Discussed increasing the Remeron to 7.5 mg and decreasing Klonopin to 0.25 mg at bedtime patient stated understanding.

## 2013-05-31 ENCOUNTER — Encounter (HOSPITAL_COMMUNITY): Payer: Self-pay | Admitting: Emergency Medicine

## 2013-05-31 ENCOUNTER — Emergency Department (HOSPITAL_COMMUNITY)
Admission: EM | Admit: 2013-05-31 | Discharge: 2013-05-31 | Disposition: A | Discharge status: Home / Self Care | Payer: Medicare Other | Attending: Emergency Medicine | Admitting: Emergency Medicine

## 2013-05-31 DIAGNOSIS — H409 Unspecified glaucoma: Secondary | ICD-10-CM | POA: Insufficient documentation

## 2013-05-31 DIAGNOSIS — Z85820 Personal history of malignant melanoma of skin: Secondary | ICD-10-CM | POA: Insufficient documentation

## 2013-05-31 DIAGNOSIS — I10 Essential (primary) hypertension: Secondary | ICD-10-CM | POA: Insufficient documentation

## 2013-05-31 DIAGNOSIS — Z79899 Other long term (current) drug therapy: Secondary | ICD-10-CM | POA: Insufficient documentation

## 2013-05-31 DIAGNOSIS — Z862 Personal history of diseases of the blood and blood-forming organs and certain disorders involving the immune mechanism: Secondary | ICD-10-CM | POA: Insufficient documentation

## 2013-05-31 DIAGNOSIS — F410 Panic disorder [episodic paroxysmal anxiety] without agoraphobia: Secondary | ICD-10-CM | POA: Insufficient documentation

## 2013-05-31 DIAGNOSIS — G4700 Insomnia, unspecified: Secondary | ICD-10-CM | POA: Insufficient documentation

## 2013-05-31 NOTE — ED Notes (Signed)
He states he is receiving very good outpatient treatment at [facility]. For anxiety and depression.  He denies s.i./h.i. And is in no distress.  He states he was recently prescribed Remeron which he finds helpful.  He has been in the process of weaning from Klonipin; his initial dosage being 1.5mg .  He has been successful in lowering this dosage to 0.5mg .  Yesterday, upon advisement of his male psychiatrist he increased the dosage of Remeron, and attempted a dose of 0.25mg  of Klonipin.  He states she had significant insomnia and "woke up a different person this morning"; meaning much anxiety and nervousness.  He is seeking our advice on his medication regimin.

## 2013-05-31 NOTE — ED Provider Notes (Signed)
CSN: [id number]     Arrival date & time 05/31/13  1524 History   First MD Initiated Contact with Patient 05/31/13 1702     Chief Complaint  Patient presents with  . Mental Health Problem   (Consider location/radiation/quality/duration/timing/severity/associated sxs/prior Treatment) Patient is a 67 y.o. male presenting with mental health disorder. The history is provided by the patient.  Mental Health Problem  patient here complaining of insomnia after decreasing his dose of Klonopin last evening. He is currently also on Remeron and his dose was increased last night. He is on a gradual Klonopin taper. He denies any suicidal or homicidal ideations. States that last night his sleep is very erratic. Called his psychiatrist was to come to the ER. He denies any other complaints of at this time  Past Medical History  Diagnosis Date  . Hypertension   . Depression   . Blood transfusion without reported diagnosis     age 46  . Cancer     melanoma calf and back  . Glaucoma   . Cataract   . Anemia     as child  . Anxiety attack    Past Surgical History  Procedure Laterality Date  . Melanoma excision    . Tonsillectomy      pt unsure  . Colonoscopy    . Polypectomy     Family History  Problem Relation Age of Onset  . Colon cancer Neg Hx   . Rectal cancer Neg Hx   . Stomach cancer Neg Hx   . Depression Mother   . Depression Maternal Grandfather    History  Substance Use Topics  . Smoking status: Never Smoker   . Smokeless tobacco: Never Used  . Alcohol Use: 0.0 oz/week    1-2 Glasses of wine per week     Comment: rare-once every few months maybe    Review of Systems  All other systems reviewed and are negative.    Allergies  Diphenhydramine  Home Medications   Current Outpatient Rx  Name  Route  Sig  Dispense  Refill  . allopurinol (ZYLOPRIM) 100 MG tablet   Oral   Take 100 mg by mouth every morning.          Marland Kitchen buPROPion (WELLBUTRIN XL) 300 MG 24 hr tablet  Oral   Take 300 mg by mouth every morning.          . calcium-vitamin D (OSCAL WITH D) 500-200 MG-UNIT per tablet   Oral   Take 2 tablets by mouth daily with breakfast.         . clonazePAM (KLONOPIN) 0.5 MG tablet   Oral   Take 0.25 mg by mouth at bedtime.          . Coenzyme Q10 (CO Q-10) 300 MG CAPS   Oral   Take 1 capsule by mouth every morning.          . Glucosamine-Chondroit-Vit C-Mn (GLUCOSAMINE 1500 COMPLEX PO)   Oral   Take 1,500 mg by mouth every morning.          . mirtazapine (REMERON) 7.5 MG tablet   Oral   Take 7.5 mg by mouth at bedtime.         Marland Kitchen omega-3 acid ethyl esters (LOVAZA) 1 G capsule   Oral   Take 2 g by mouth every morning.          Marland Kitchen OVER THE COUNTER MEDICATION   Oral   Take 120 mg by mouth every  morning. Ginko Biloba 120 mg         . OVER THE COUNTER MEDICATION   Oral   Take 8 tablets by mouth daily. [employer] and Vitality Vitapak.         . travoprost, benzalkonium, (TRAVATAN) 0.004 % ophthalmic solution   Both Eyes   Place 1 drop into both eyes at bedtime.         . valsartan (DIOVAN) 80 MG tablet   Oral   Take 80 mg by mouth every morning.           BP 135/87  Pulse 85  Temp(Src) 98 F (36.7 C) (Oral)  Resp 20  SpO2 98% Physical Exam  Nursing note and vitals reviewed. Constitutional: He is oriented to person, place, and time. He appears well-developed and well-nourished.  Non-toxic appearance. No distress.  HENT:  Head: Normocephalic and atraumatic.  Eyes: Conjunctivae, EOM and lids are normal. Pupils are equal, round, and reactive to light.  Neck: Normal range of motion. Neck supple. No tracheal deviation present. No mass present.  Cardiovascular: Normal rate, regular rhythm and normal heart sounds.  Exam reveals no gallop.   No murmur heard. Pulmonary/Chest: Effort normal and breath sounds normal. No stridor. No respiratory distress. He has no decreased breath sounds. He has no wheezes. He has no  rhonchi. He has no rales.  Abdominal: Soft. Normal appearance and bowel sounds are normal. He exhibits no distension. There is no tenderness. There is no rebound and no CVA tenderness.  Musculoskeletal: Normal range of motion. He exhibits no edema and no tenderness.  Neurological: He is alert and oriented to person, place, and time. He has normal strength. No cranial nerve deficit or sensory deficit. GCS eye subscore is 4. GCS verbal subscore is 5. GCS motor subscore is 6.  Skin: Skin is warm and dry. No abrasion and no rash noted.  Psychiatric: He has a normal mood and affect. His speech is normal and behavior is normal.    ED Course  Procedures (including critical care time) Labs Review Labs Reviewed - No data to display Imaging Review No results found.  EKG Interpretation   None       MDM  No diagnosis found. Patient instructed to increase his Klonopin back up to 0.5 mg from 0.25 mg and follow with his psychiatrist on Monday    Toy Baker, MD 05/31/13 1723

## 2013-06-02 ENCOUNTER — Other Ambulatory Visit (HOSPITAL_COMMUNITY): Payer: Medicare Other | Admitting: Psychiatry

## 2013-06-02 DIAGNOSIS — F418 Other specified anxiety disorders: Secondary | ICD-10-CM

## 2013-06-02 MED ORDER — CLONAZEPAM 0.5 MG PO TABS: 0.5000 mg | ORAL_TABLET | Freq: Every day | ORAL | Status: DC

## 2013-06-02 NOTE — Progress Notes (Signed)
    Daily Group Progress Note  Program: IOP  Group Time: 9:00-10:30 am   Participation Level: Active  Behavioral Response: Appropriate  Type of Therapy:  Process Group  Summary of Progress: pt reports stable mood but fluctuates between anxiety and calm. Pt feels less overwhelmed after making the decision not to participate in the public speaking engagement. Pt received support for making a decision on that and following through.      Group Time: 10:30 am - 12:00 pm   Participation Level:  Active  Behavioral Response: Appropriate  Type of Therapy: Psycho-education Group  Summary of Progress: Group ended an hour early due to Clinical research associate having a family emergency.  Carman Ching, LCSW

## 2013-06-02 NOTE — Progress Notes (Signed)
    Daily Group Progress Note  Program: IOP  Group Time: 9:00-10:30 am   Participation Level: Minimal  Behavioral Response: Appropriate  Type of Therapy:  Process Group  Summary of Progress: Pt reports "having a difficult day today" but did not offer to share. Pt listened and did not participate verbally. Pt continues to work on managing financial stress, social anxiety and coming to terms with his retirement.      Group Time: 10:30 am - 12:00 pm   Participation Level:  Active  Behavioral Response: Appropriate  Type of Therapy: Psycho-education Group  Summary of Progress: Pt participated in a group with a focus on grief and loss and identified losses impacting overall wellness and effective grieving strategies.   Carman Ching, LCSW

## 2013-06-02 NOTE — Progress Notes (Signed)
Patient ID: Brian Maxwell, male   DOB: 06-22-1946, 67 y.o.   MRN: [id number] Patient reviewed and interviewed today, had decreased his Klonopin to 0.25 mg at bedtime which did not help him and he had insomnia and panic attacks and had to go to the emergency room. He is currently now on 0.5 mg and seems to be doing well with that. He'll continue his Remeron 7.5 mg at bedtime. Mood appears to be better less anxious no suicidal or homicidal ideation no hallucinations or delusions.

## 2013-06-03 ENCOUNTER — Other Ambulatory Visit (HOSPITAL_COMMUNITY): Payer: Medicare Other | Admitting: Psychiatry

## 2013-06-03 DIAGNOSIS — F418 Other specified anxiety disorders: Secondary | ICD-10-CM

## 2013-06-04 ENCOUNTER — Other Ambulatory Visit (HOSPITAL_COMMUNITY): Payer: Medicare Other | Admitting: Psychiatry

## 2013-06-04 DIAGNOSIS — F418 Other specified anxiety disorders: Secondary | ICD-10-CM

## 2013-06-04 NOTE — Progress Notes (Signed)
    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 Pt said he struggles with over thinking things and have skills to distract away from his thinking.      Group Time: 10:30 am - 12:00 pm   Participation Level:  Active  Behavioral Response: Appropriate  Type of Therapy: Psycho-education Group  Summary of Progress:  pt learned the skill of mindfulness and how to apply it to every day life to reduce stress and depression and be "in the moment".  Carman Ching, LCSW

## 2013-06-04 NOTE — Progress Notes (Signed)
Patient ID: Brian Maxwell, male   DOB: November 23, 1945, 67 y.o.   MRN: [id number] Patient reviewed and interviewed today, is tolerating his medications well and has not had any anxiety attacks. Sleep and appetite are good mood is stable with no suicidal or homicidal ideation and no hallucinations or delusions.

## 2013-06-04 NOTE — Progress Notes (Signed)
    Daily Group Progress Note  Program: IOP  Group Time: 9:00-10:30 am   Participation Level: Active  Behavioral Response: Appropriate  Type of Therapy:  Process Group  Summary of Progress:  Pt participated in discussion what makes group effective and processed barriers to maximizing progress in a group setting. Pt spoke about ways they could "sabotage their wellness" by not committing fully to the process and not being fully honest with what they struggles are.       Group Time: 10:30 am - 12:00 pm   Participation Level:  Active  Behavioral Response: Appropriate  Type of Therapy: Psycho-education Group  Summary of Progress: Pt participated in discussion what makes group effective and processed barriers to maximizing progress in a group setting. Pt spoke about ways they could "sabotage their wellness" by not committing fully to the process and not being fully honest with what they struggles are.    Priscilla Finklea E, LCSW 

## 2013-06-05 ENCOUNTER — Other Ambulatory Visit (HOSPITAL_COMMUNITY): Payer: Medicare Other | Admitting: Psychiatry

## 2013-06-05 DIAGNOSIS — F418 Other specified anxiety disorders: Secondary | ICD-10-CM

## 2013-06-05 NOTE — Progress Notes (Signed)
    Daily Group Progress Note  Program: IOP  Group Time: 9:00-10:30 am   Participation Level: Active  Behavioral Response: Appropriate  Type of Therapy:  Process Group  Summary of Progress: Pt reports improved mood with less anxiety and attributes it to talking to his supervisor about not returning to work next semester. Pt said he was offered the opportunity to go on medical leave for the semester to determine if he wants to return at a later date and Pt appeared hopeful about this possibility.      Group Time: 10:30 am - 12:00 pm   Participation Level:  Active  Behavioral Response: Appropriate  Type of Therapy: Psycho-education Group  Summary of Progress:  Pt participated in a goodbye ceremony for two members ending the group today and practiced having healthy closure and grieving losses.   Carman Ching, LCSW

## 2013-06-06 ENCOUNTER — Other Ambulatory Visit (HOSPITAL_COMMUNITY): Payer: Medicare Other | Admitting: Psychiatry

## 2013-06-06 DIAGNOSIS — F418 Other specified anxiety disorders: Secondary | ICD-10-CM

## 2013-06-06 NOTE — Progress Notes (Signed)
    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 He admitted that his anxiety comes and goes from day to day and he can get impulsive with his actions when in an anxious state. Today he feels calm.      Group Time: 10:30 am - 12:00 pm   Participation Level:  Active  Behavioral Response: Appropriate  Type of Therapy: Psycho-education Group  Summary of Progress: Pt participated in a goodbye ceremony and was introduced and practiced the skill of distraction to shift away from worrisome thinking and create positive feelings to replace the worry.   Carman Ching, LCSW

## 2013-06-09 ENCOUNTER — Other Ambulatory Visit (HOSPITAL_COMMUNITY): Payer: Medicare Other | Attending: Psychiatry | Admitting: Psychiatry

## 2013-06-09 DIAGNOSIS — F410 Panic disorder [episodic paroxysmal anxiety] without agoraphobia: Secondary | ICD-10-CM | POA: Insufficient documentation

## 2013-06-09 DIAGNOSIS — F418 Other specified anxiety disorders: Secondary | ICD-10-CM

## 2013-06-09 DIAGNOSIS — F332 Major depressive disorder, recurrent severe without psychotic features: Secondary | ICD-10-CM | POA: Insufficient documentation

## 2013-06-09 DIAGNOSIS — F411 Generalized anxiety disorder: Secondary | ICD-10-CM | POA: Insufficient documentation

## 2013-06-09 NOTE — Progress Notes (Signed)
    Daily Group Progress Note  Program: IOP  Group Time: 9:00-10:30 am   Participation Level: Active  Behavioral Response: Appropriate  Type of Therapy:  Process Group  Summary of Progress: Pt reports feeling very good today and said he is ready to end the group tomorrow. Pts main struggle has been managing his anxiety. Pt has racing, worrisome thinking that cause high anxiety symptoms. Pt tends to intellectualize things when he is very anxious, but he did not do much of that today. Pt also talked about not wanting to be on medication for the rest of his life due to "needing to feel in control".      Group Time: 10:30 am - 12:00 pm   Participation Level:  Active  Behavioral Response: Appropriate  Type of Therapy: Psycho-education Group  Summary of Progress: Pt participated in a group on grief and loss and identified current losses impacting overall wellness and effective grieving strategies.   Carman Ching, LCSW

## 2013-06-10 ENCOUNTER — Other Ambulatory Visit (HOSPITAL_COMMUNITY): Payer: Medicare Other | Admitting: Psychiatry

## 2013-06-10 DIAGNOSIS — F418 Other specified anxiety disorders: Secondary | ICD-10-CM

## 2013-06-10 NOTE — Progress Notes (Signed)
Patient ID: Brian Maxwell, male   DOB: 1946-05-15, 67 y.o.   MRN: [id number] D: This is a 67 y.o. Married, Caucasian male, who was referred per ED due to worsening depressive and anxiety symptoms. Pt was brought to [facility] by his spouse. He has a long history depression starting in 1968 but the last few months have worsened. His stress has escalated with related symptoms of not sleeping well and loss of appetite. Pt states he only sleeps ~ four hours at night. His appetite is poor, lost 10 pounds in 1 week. He also has loss of interest in usual pleasures and feels hopeless. C/O 2-3 panic attacks per week. 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 Pt had been an associate professor (marketing/business) for thirty one years. He is also concerned about the pattern of his depression. 2) Sexual dysfunction since July 2014. Reports no erection. 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 States the lawsuit has been going on for fourteen years in [place].  Pt completed MH-IOP today.  Reports improved appetite.  C/O inconsistent sleep and continued anxiety.  Denies any SI/HI or A/V hallucinations.  States that the groups were helpful.  Pt plans to return to work on 10-05-13.  A:  D/C today.  F/U with Dr. Donell Beers on 06-26-13.  Pt to schedule an appt with Dr. Jen Mow.  Encouraged support groups.  Provided pt with [facility] information.  R:  Pt receptive.

## 2013-06-10 NOTE — Patient Instructions (Signed)
Pt completed MH-IOP today.  Will follow up with Dr. Jen Mow and Dr. Donell Beers (06-26-13 @ 1:30pm).  Encouraged support groups.

## 2013-06-10 NOTE — Progress Notes (Signed)
Discharge Note  Patient:  Brian Maxwell is an 67 y.o., male DOB:  08/12/1945  Date of Admission:  05/26/13  Date of Discharge:  06/10/13  Reason for Admission: Depression and anxiety  Hospital Course: Patient started IOP and was experiencing severe depression and panic attacks with severe insomnia poor appetite. His Ambien was discontinued and he was started on Remeron 7.5 mg at bedtime and his other medications were continued. Patient gradually stabilized and did well stating that the groups were extremely helpful to calm his anxiety down. His mood improved his sleep and appetite got better and he was able to sleep all night. He rescinded his resignation and is planning to return to work March 1. He was coping well and tolerating his medications well.  Mental Status at Discharge: Alert, oriented x3, affect is full mood is euthymic speech is normal with no suicidal or homicidal ideation. No hallucinations or delusions. Recent and remote memory is good, judgment and insight is good, concentration and recall are good.  Lab Results: No results found for this or any previous visit (from the past 48 hour(s)).  Current outpatient prescriptions:allopurinol (ZYLOPRIM) 100 MG tablet, Take 100 mg by mouth every morning. , Disp: , Rfl: ;  buPROPion (WELLBUTRIN XL) 300 MG 24 hr tablet, Take 300 mg by mouth every morning. , Disp: , Rfl: ;  calcium-vitamin D (OSCAL WITH D) 500-200 MG-UNIT per tablet, Take 2 tablets by mouth daily with breakfast., Disp: , Rfl:  clonazePAM (KLONOPIN) 0.5 MG tablet, Take 1 tablet (0.5 mg total) by mouth at bedtime., Disp: 30 tablet, Rfl: 0 recurrent;  Coenzyme Q10 (CO Q-10) 300 MG CAPS, Take 1 capsule by mouth every morning. , Disp: , Rfl: ;  Glucosamine-Chondroit-Vit C-Mn (GLUCOSAMINE 1500 COMPLEX PO), Take 1,500 mg by mouth every morning. , Disp: , Rfl: ;  mirtazapine (REMERON) 7.5 MG tablet, Take 7.5 mg by mouth at bedtime., Disp: , Rfl:  omega-3 acid ethyl esters  (LOVAZA) 1 G capsule, Take 2 g by mouth every morning. , Disp: , Rfl: ;  OVER THE COUNTER MEDICATION, Take 120 mg by mouth every morning. Ginko Biloba 120 mg, Disp: , Rfl: ;  OVER THE COUNTER MEDICATION, Take 8 tablets by mouth daily. [employer] Performance and Vitality Vitapak., Disp: , Rfl: ;  travoprost, benzalkonium, (TRAVATAN) 0.004 % ophthalmic solution, Place 1 drop into both eyes at bedtime., Disp: , Rfl:  valsartan (DIOVAN) 80 MG tablet, Take 80 mg by mouth every morning. , Disp: , Rfl:   Axis Diagnosis:   Axis I: Generalized Anxiety Disorder and Major Depression, Recurrent severe Axis II: Deferred Axis III:  Past Medical History  Diagnosis Date  . Hypertension   . Depression   . Blood transfusion without reported diagnosis     age 42  . Cancer     melanoma calf and back  . Glaucoma   . Cataract   . Anemia     as child  . Anxiety attack    Axis IV: occupational problems and problems related to social environment Axis V: 61-70 mild symptoms   Level of Care:  OP  Discharge destination:  Home  Is patient on multiple antipsychotic therapies at discharge:  No    Has Patient had three or more failed trials of antipsychotic monotherapy by history:  No  Patient phone:  [phone] (home)  Patient address:   [address] [place] [address],   Follow-up recommendations: Followup for medications with Dr. Donell Beers and therapy with  Dr. Barbie Haggis  The patient received suicide prevention pamphlet:  Yes   Margit Banda 06/10/2013, 12:12 PM

## 2013-06-10 NOTE — Progress Notes (Signed)
    Daily Group Progress Note  Program: IOP  Group Time: 9:00-10:30 am   Participation Level: Active  Behavioral Response: Appropriate  Type of Therapy:  Process Group  Summary of Progress: Today was Pts last day in the group. Pt reports significant improvement in mood and ability to function with a reduction in depressive and anxiety symptoms. 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      Group Time: 10:30 am - 12:00 pm   Participation Level:  Active  Behavioral Response: Appropriate  Type of Therapy: Psycho-education Group  Summary of Progress: Pt learned about how to use aromatherapy to manage depression, anxiety and improve sleep.   Carman Ching, LCSW

## 2013-06-11 ENCOUNTER — Other Ambulatory Visit (HOSPITAL_COMMUNITY): Payer: Medicare Other

## 2013-06-12 ENCOUNTER — Other Ambulatory Visit (HOSPITAL_COMMUNITY): Payer: Medicare Other

## 2013-06-13 ENCOUNTER — Other Ambulatory Visit (HOSPITAL_COMMUNITY): Payer: Medicare Other

## 2013-06-16 ENCOUNTER — Other Ambulatory Visit (HOSPITAL_COMMUNITY): Payer: Medicare Other

## 2014-04-29 ENCOUNTER — Other Ambulatory Visit: Payer: Self-pay | Admitting: Dermatology

## 2014-05-18 ENCOUNTER — Emergency Department (HOSPITAL_COMMUNITY): Payer: Medicare Other

## 2014-05-18 ENCOUNTER — Emergency Department (HOSPITAL_COMMUNITY)
Admission: EM | Admit: 2014-05-18 | Discharge: 2014-05-18 | Disposition: A | Discharge status: Home / Self Care | Payer: Medicare Other | Attending: Emergency Medicine | Admitting: Emergency Medicine

## 2014-05-18 ENCOUNTER — Encounter (HOSPITAL_COMMUNITY): Payer: Self-pay | Admitting: Emergency Medicine

## 2014-05-18 DIAGNOSIS — Z85820 Personal history of malignant melanoma of skin: Secondary | ICD-10-CM | POA: Insufficient documentation

## 2014-05-18 DIAGNOSIS — I10 Essential (primary) hypertension: Secondary | ICD-10-CM | POA: Insufficient documentation

## 2014-05-18 DIAGNOSIS — F329 Major depressive disorder, single episode, unspecified: Secondary | ICD-10-CM | POA: Diagnosis not present

## 2014-05-18 DIAGNOSIS — Z79899 Other long term (current) drug therapy: Secondary | ICD-10-CM | POA: Diagnosis not present

## 2014-05-18 DIAGNOSIS — F419 Anxiety disorder, unspecified: Secondary | ICD-10-CM | POA: Insufficient documentation

## 2014-05-18 DIAGNOSIS — Z862 Personal history of diseases of the blood and blood-forming organs and certain disorders involving the immune mechanism: Secondary | ICD-10-CM | POA: Insufficient documentation

## 2014-05-18 DIAGNOSIS — K7689 Other specified diseases of liver: Secondary | ICD-10-CM | POA: Insufficient documentation

## 2014-05-18 DIAGNOSIS — K5900 Constipation, unspecified: Secondary | ICD-10-CM | POA: Diagnosis present

## 2014-05-18 DIAGNOSIS — H409 Unspecified glaucoma: Secondary | ICD-10-CM | POA: Diagnosis not present

## 2014-05-18 LAB — COMPREHENSIVE METABOLIC PANEL
ALK PHOS: 80 U/L (ref 39–117)
ALT: 21 U/L (ref 0–53)
AST: 25 U/L (ref 0–37)
Albumin: 4.1 g/dL (ref 3.5–5.2)
Anion gap: 14 (ref 5–15)
BILIRUBIN TOTAL: 0.5 mg/dL (ref 0.3–1.2)
BUN: 15 mg/dL (ref 6–23)
CHLORIDE: 103 meq/L (ref 96–112)
CO2: 26 mEq/L (ref 19–32)
Calcium: 9.8 mg/dL (ref 8.4–10.5)
Creatinine, Ser: 0.94 mg/dL (ref 0.50–1.35)
GFR calc Af Amer: >90 mL/min (ref >90)
GFR calc non Af Amer: 85 mL/min — ABNORMAL LOW (ref >90)
Glucose, Bld: 85 mg/dL (ref 70–99)
POTASSIUM: 4.5 meq/L (ref 3.7–5.3)
Sodium: 143 mEq/L (ref 137–147)
Total Protein: 7.4 g/dL (ref 6.0–8.3)

## 2014-05-18 LAB — LIPASE, BLOOD: Lipase: 29 U/L (ref 11–59)

## 2014-05-18 LAB — URINALYSIS, ROUTINE W REFLEX MICROSCOPIC
BILIRUBIN URINE: NEGATIVE
GLUCOSE, UA: NEGATIVE mg/dL
HGB URINE DIPSTICK: NEGATIVE
Ketones, ur: NEGATIVE mg/dL
Leukocytes, UA: NEGATIVE
Nitrite: NEGATIVE
PH: 6.5 (ref 5.0–8.0)
Protein, ur: NEGATIVE mg/dL
Specific Gravity, Urine: 1.007 (ref 1.005–1.030)
UROBILINOGEN UA: 0.2 mg/dL (ref 0.0–1.0)

## 2014-05-18 LAB — CBC WITH DIFFERENTIAL/PLATELET
BASOS PCT: 1 % (ref 0–1)
Basophils Absolute: 0 10*3/uL (ref 0.0–0.1)
Eosinophils Absolute: 0.1 10*3/uL (ref 0.0–0.7)
Eosinophils Relative: 2 % (ref 0–5)
HCT: 42.4 % (ref 39.0–52.0)
Hemoglobin: 15 g/dL (ref 13.0–17.0)
LYMPHS ABS: 2.5 10*3/uL (ref 0.7–4.0)
Lymphocytes Relative: 29 % (ref 12–46)
MCH: 33.3 pg (ref 26.0–34.0)
MCHC: 35.4 g/dL (ref 30.0–36.0)
MCV: 94.2 fL (ref 78.0–100.0)
MONOS PCT: 10 % (ref 3–12)
Monocytes Absolute: 0.9 10*3/uL (ref 0.1–1.0)
NEUTROS ABS: 5.2 10*3/uL (ref 1.7–7.7)
Neutrophils Relative %: 60 % (ref 43–77)
PLATELETS: 212 10*3/uL (ref 150–400)
RBC: 4.5 MIL/uL (ref 4.22–5.81)
RDW: 12.9 % (ref 11.5–15.5)
WBC: 8.7 10*3/uL (ref 4.0–10.5)

## 2014-05-18 MED ORDER — POLYETHYLENE GLYCOL 3350 17 G PO PACK: 17.0000 g | PACK | Freq: Two times a day (BID) | ORAL | Status: DC

## 2014-05-18 MED ORDER — IOHEXOL 300 MG/ML  SOLN
100.0000 mL | Freq: Once | INTRAMUSCULAR | Status: AC | PRN
  Administered 2014-05-18: 100 mL via INTRAVENOUS

## 2014-05-18 MED ORDER — MORPHINE SULFATE 4 MG/ML IJ SOLN: 4.0000 mg | Freq: Once | INTRAMUSCULAR | Status: DC | PRN

## 2014-05-18 MED ORDER — ONDANSETRON HCL 4 MG/2ML IJ SOLN
4.0000 mg | Freq: Once | INTRAMUSCULAR | Status: AC
  Administered 2014-05-18: 4 mg via INTRAVENOUS
  Filled 2014-05-18: qty 2

## 2014-05-18 MED ORDER — SODIUM CHLORIDE 0.9 % IV SOLN: 1000.0000 mL | INTRAVENOUS | Status: DC

## 2014-05-18 MED ORDER — SODIUM CHLORIDE 0.9 % IV SOLN
1000.0000 mL | Freq: Once | INTRAVENOUS | Status: AC
  Administered 2014-05-18: 1000 mL via INTRAVENOUS

## 2014-05-18 MED ORDER — IOHEXOL 300 MG/ML  SOLN
50.0000 mL | Freq: Once | INTRAMUSCULAR | Status: AC | PRN
  Administered 2014-05-18: 50 mL via ORAL

## 2014-05-18 NOTE — ED Provider Notes (Signed)
CSN: [id number]     Arrival date & time 05/18/14  1006 History   First MD Initiated Contact with Patient 05/18/14 1037     Chief Complaint  Patient presents with  . Constipation    Patient is a 68 y.o. male presenting with constipation. The history is provided by the patient.  Constipation Time since last bowel movement:  1 week Timing:  Constant Chronicity:  New Context: not dehydration, not medication and not narcotics   Relieved by:  Nothing Associated symptoms: no abdominal pain, no fever, no nausea and no vomiting   He has glycerin, metamucil and herbal treatments. He has the urge to go and keeps trying to have a BM but nothing happens.  He does pass gas. Past Medical History  Diagnosis Date  . Hypertension   . Depression   . Blood transfusion without reported diagnosis     age 58  . Cancer     melanoma calf and back  . Glaucoma   . Cataract   . Anemia     as child  . Anxiety attack    Past Surgical History  Procedure Laterality Date  . Melanoma excision    . Tonsillectomy      pt unsure  . Colonoscopy    . Polypectomy     Family History  Problem Relation Age of Onset  . Colon cancer Neg Hx   . Rectal cancer Neg Hx   . Stomach cancer Neg Hx   . Depression Mother   . Depression Maternal Grandfather    History  Substance Use Topics  . Smoking status: Never Smoker   . Smokeless tobacco: Never Used  . Alcohol Use: 0.0 oz/week    1-2 Glasses of wine per week     Comment: rare-once every few months maybe    Review of Systems  Constitutional: Negative for fever.  Gastrointestinal: Positive for constipation. Negative for nausea, vomiting and abdominal pain.  All other systems reviewed and are negative.     Allergies  Diphenhydramine  Home Medications   Prior to Admission medications   Medication Sig Start Date End Date Taking? Authorizing Provider  allopurinol (ZYLOPRIM) 100 MG tablet Take 100 mg by mouth every morning.    Yes Historical Provider,  MD  buPROPion (WELLBUTRIN XL) 300 MG 24 hr tablet Take 300 mg by mouth daily.   Yes Historical Provider, MD  clonazePAM (KLONOPIN) 0.5 MG tablet Take 0.5-1 mg by mouth at bedtime as needed for anxiety.   Yes Historical Provider, MD  mirtazapine (REMERON) 7.5 MG tablet Take 7.5-15 mg by mouth at bedtime as needed (for sleep).    Yes Historical Provider, MD  OVER THE COUNTER MEDICATION Take 8 tablets by mouth daily. [place] Performance and Vitality [place].   Yes Historical Provider, MD  travoprost, benzalkonium, (TRAVATAN) 0.004 % ophthalmic solution Place 1 drop into both eyes at bedtime.   Yes Historical Provider, MD  valsartan (DIOVAN) 80 MG tablet Take 80 mg by mouth every morning.    Yes Historical Provider, MD  polyethylene glycol (MIRALAX / GLYCOLAX) packet Take 17 g by mouth 2 (two) times daily. 05/18/14   Dorie Rank, MD   BP 128/84  Pulse 73  Temp(Src) 98.2 F (36.8 C) (Oral)  Resp 16  SpO2 99% Physical Exam  Nursing note and vitals reviewed. Constitutional: He appears well-developed and well-nourished. No distress.  HENT:  Head: Normocephalic and atraumatic.  Right Ear: External ear normal.  Left Ear: External ear normal.  Eyes:  Conjunctivae are normal. Right eye exhibits no discharge. Left eye exhibits no discharge. No scleral icterus.  Neck: Neck supple. No tracheal deviation present.  Cardiovascular: Normal rate, regular rhythm and intact distal pulses.   Pulmonary/Chest: Effort normal and breath sounds normal. No stridor. No respiratory distress. He has no wheezes. He has no rales.  Abdominal: Soft. Bowel sounds are normal. He exhibits no distension. There is tenderness in the left lower quadrant. There is no rigidity, no rebound and no guarding. No hernia.  Genitourinary: Rectal exam shows no mass and no tenderness.  No fecal impaction  Musculoskeletal: He exhibits no edema and no tenderness.  Neurological: He is alert. He has normal strength. No cranial nerve deficit (no  facial droop, extraocular movements intact, no slurred speech) or sensory deficit. He exhibits normal muscle tone. He displays no seizure activity. Coordination normal.  Skin: Skin is warm and dry. No rash noted.  Psychiatric: He has a normal mood and affect.    ED Course  Procedures (including critical care time) Labs Review Labs Reviewed  COMPREHENSIVE METABOLIC PANEL - Abnormal; Notable for the following:    GFR calc non Af Amer 85 (*)    All other components within normal limits  URINALYSIS, ROUTINE W REFLEX MICROSCOPIC - Abnormal; Notable for the following:    APPearance CLOUDY (*)    All other components within normal limits  CBC WITH DIFFERENTIAL  LIPASE, BLOOD    Imaging Review Ct Abdomen Pelvis W Contrast  05/18/2014   CLINICAL DATA:  68 year old male presenting with constipation, bloated feeling and low pelvic pressure for the past week. History melanoma 2014. Initial encounter.  EXAM: CT ABDOMEN AND PELVIS WITH CONTRAST  TECHNIQUE: Multidetector CT imaging of the abdomen and pelvis was performed using the standard protocol following bolus administration of intravenous contrast.  CONTRAST:  2mL OMNIPAQUE IOHEXOL 300 MG/ML SOLN, 124mL OMNIPAQUE IOHEXOL 300 MG/ML SOLN  COMPARISON:  None.  FINDINGS: No extra luminal bowel inflammatory process, free fluid or free air.  Four sub cm low-density structures within the liver of indeterminate etiology, too small to adequately characterize. Statistically, these are usually cysts however, given history of melanoma, one may consider establishing stability on follow-up in 6 months (or sooner if clinically indicated).  No worrisome splenic, pancreatic or adrenal abnormality.  Bilateral nonobstructing small renal calculi.  3 cm left renal cyst.  No adenopathy.  Ectatic aorta with calcified plaque without aneurysmal dilation.  Calcification aortic valve.  Heart size within normal limits.  Minimal atelectatic changes lung bases.  Mild degenerative  changes lower lumbar spine. No osseous destructive lesion.  Noncontrast filled views of the urinary bladder unremarkable.  Small prostate gland calcifications incidentally noted.  IMPRESSION: No extra luminal bowel inflammatory process, free fluid or free air.  Four sub cm low-density structures within the liver of indeterminate etiology, too small to adequately characterize. Statistically, these are usually cysts however, given history of melanoma, one may consider establishing stability on follow-up in 6 months (or sooner if clinically indicated).  Bilateral nonobstructing small renal calculi.  3 cm left renal cyst.  No adenopathy.  Ectatic aorta with calcified plaque without aneurysmal dilation.   Electronically Signed   By: Chauncey Cruel M.D.   On: 05/18/2014 14:42   Medications  0.9 %  sodium chloride infusion (1,000 mLs Intravenous New Bag/Given 05/18/14 1157)    Followed by  0.9 %  sodium chloride infusion (not administered)  morphine 4 MG/ML injection 4 mg (not administered)  ondansetron (ZOFRAN) injection  4 mg (4 mg Intravenous Given 05/18/14 1158)  iohexol (OMNIPAQUE) 300 MG/ML solution 50 mL (50 mLs Oral Contrast Given 05/18/14 1246)  iohexol (OMNIPAQUE) 300 MG/ML solution 100 mL (100 mLs Intravenous Contrast Given 05/18/14 1405)     MDM   Final diagnoses:  Constipation, unspecified constipation type  Liver cyst    CT scan without acute findings.  Discussed liver cysts and need for outpatient follow (need for repeat imaging in 6 months).  Will dc home with laxative.  Follow up with his GI doctor    Dorie Rank, MD 05/18/14 1510

## 2014-05-18 NOTE — ED Notes (Signed)
MD at bedside. 

## 2014-05-18 NOTE — ED Notes (Signed)
Pt states he has been constipated x 1 week, had small BM on Friday but not much. Pt states normally goes every day. Pt denies n/v states he feels bloated.

## 2014-05-18 NOTE — Discharge Instructions (Signed)

## 2014-05-18 NOTE — ED Notes (Signed)
Pt given urinal and made aware of need for urine specimen 

## 2014-06-01 ENCOUNTER — Telehealth: Payer: Self-pay | Admitting: Gastroenterology

## 2014-06-01 NOTE — Telephone Encounter (Signed)
Patient will come in and see Dr. Fuller Plan on 06/03/14 3:45

## 2014-06-01 NOTE — Telephone Encounter (Signed)
Left message for patient to call back  

## 2014-06-03 ENCOUNTER — Encounter: Payer: Self-pay | Admitting: Gastroenterology

## 2014-06-03 ENCOUNTER — Ambulatory Visit (INDEPENDENT_AMBULATORY_CARE_PROVIDER_SITE_OTHER): Payer: Medicare Other | Admitting: Gastroenterology

## 2014-06-03 VITALS — BP 124/80 | HR 76 | Ht 70.0 in | Wt 218.0 lb

## 2014-06-03 DIAGNOSIS — K5900 Constipation, unspecified: Secondary | ICD-10-CM

## 2014-06-03 DIAGNOSIS — R932 Abnormal findings on diagnostic imaging of liver and biliary tract: Secondary | ICD-10-CM

## 2014-06-03 DIAGNOSIS — Z86010 Personal history of colonic polyps: Secondary | ICD-10-CM

## 2014-06-03 NOTE — Progress Notes (Signed)
    History of Present Illness: This is a 68 year old male who relates worsening constipation over the past few months. Has a long history of constipation that has responded to the regular use of Metamucil for many years. After severe episode of constipation and bloating he presented to the emergency department and was evaluated. Magnesium citrate completely relieved his constipation on 2 episodes since his ED visit however his constipation return. He was advised to use MiraLAX regularly but has been reluctant to do so. He underwent colonoscopy in November 2013.  Abd/pelvic CT IMPRESSION:  No extra luminal bowel inflammatory process, free fluid or free air. Four sub cm low-density structures within the liver of indeterminate etiology, too small to adequately characterize. Statistically, these are usually cysts however, given history of melanoma, one may consider establishing stability on follow-up in 6 months (or sooner if clinically indicated).  Bilateral nonobstructing small renal calculi. 3 cm left renal cyst.  No adenopathy.  Ectatic aorta with calcified plaque without aneurysmal dilation.  Current Medications, Allergies, Past Medical History, Past Surgical History, Family History and Social History were reviewed in [employer] record.  Physical Exam: General: Well developed , well nourished, no acute distress Head: Normocephalic and atraumatic Eyes:  sclerae anicteric, EOMI Ears: Normal auditory acuity Mouth: No deformity or lesions Lungs: Clear throughout to auscultation Heart: Regular rate and rhythm; no murmurs, rubs or bruits Abdomen: Soft, non tender and non distended. No masses, hepatosplenomegaly or hernias noted. Normal Bowel sounds Rectal: DRE by EDP was normal  Musculoskeletal: Symmetrical with no gross deformities  Pulses:  Normal pulses noted Extremities: No clubbing, cyanosis, edema or deformities noted Neurological: Alert oriented x 4, grossly  nonfocal Psychological:  Alert and cooperative. Normal mood and affect  Assessment and Recommendations:   1. Constipation. Maintain a high-fiber diet with adequate daily water intake. MiraLAX 1-2 times daily titrated for adequate bowel movements. Follow-up with his PCP.  2. Abnormal CT of liver. Suspected benign hepatic cysts. Given his history of melanoma plan for a 6 month follow-up CT scan.  3. Personal history of adenomatous colon polyps. Surveillance colonoscopy recommended in November 2016.

## 2014-06-03 NOTE — Patient Instructions (Addendum)
Please use your Miralax (1 capful) 1-2 times daily.  You will be due for a recall colonoscopy in 06/2015. We will send you a reminder in the mail when it gets closer to that time.  You will be due for a CT scan of the liver in 6 months. We will call you when it gets closer to that time to schedule appointment.  Please follow up with Dr Fuller Plan as needed.  Cc: Orpah Melter, MD

## 2014-09-30 ENCOUNTER — Other Ambulatory Visit: Payer: Self-pay | Admitting: Dermatology

## 2014-11-03 ENCOUNTER — Telehealth: Payer: Self-pay

## 2014-11-03 ENCOUNTER — Other Ambulatory Visit: Payer: Self-pay

## 2014-11-03 DIAGNOSIS — R9389 Abnormal findings on diagnostic imaging of other specified body structures: Secondary | ICD-10-CM

## 2014-11-03 NOTE — Telephone Encounter (Signed)
Informed patient that he is scheduled for his scan on 12/22/14 at 2:00pm and to arrive at 1:45pm at [facility]. Left more instructions in the bag with his contrast up front. Went over when to drink contrast over the phone.  Also he needs BUN, Creatine within 6 weeks of his scan. Told him I put the lab work in our computer and he can go to the lab at his convinience in April. Pt verbalized understanding.

## 2014-11-03 NOTE — Telephone Encounter (Signed)
Per last note from 06/03/14 patient needs a repeat CT scan abdomen and pelvis to f/u hepatic cysts shown. Patient requested CT scan be scheduled on 12/22/14 in the afternoon. CT abd pelvis scheduled for 12/22/14 at 2:00pm. Called and left a message with patient to return my call so we can go over instructions.

## 2014-11-03 NOTE — Telephone Encounter (Signed)
-----   Message from Larina Bras, [place] sent at 06/03/2014  4:13 PM EDT ----- Pt needs CT scan liver around 11/06/14. See office note from 06/03/14

## 2014-12-07 ENCOUNTER — Other Ambulatory Visit (INDEPENDENT_AMBULATORY_CARE_PROVIDER_SITE_OTHER): Payer: Self-pay

## 2014-12-07 DIAGNOSIS — R938 Abnormal findings on diagnostic imaging of other specified body structures: Secondary | ICD-10-CM

## 2014-12-07 DIAGNOSIS — R9389 Abnormal findings on diagnostic imaging of other specified body structures: Secondary | ICD-10-CM

## 2014-12-07 LAB — BUN: BUN: 19 mg/dL (ref 6–23)

## 2014-12-07 LAB — CREATININE, SERUM: Creatinine, Ser: 1.08 mg/dL (ref 0.40–1.50)

## 2014-12-22 ENCOUNTER — Ambulatory Visit (INDEPENDENT_AMBULATORY_CARE_PROVIDER_SITE_OTHER)
Admission: RE | Admit: 2014-12-22 | Discharge: 2014-12-22 | Disposition: A | Discharge status: Home / Self Care | Payer: Medicare Other | Source: Ambulatory Visit | Attending: Gastroenterology | Admitting: Gastroenterology

## 2014-12-22 DIAGNOSIS — R938 Abnormal findings on diagnostic imaging of other specified body structures: Secondary | ICD-10-CM

## 2014-12-22 DIAGNOSIS — R9389 Abnormal findings on diagnostic imaging of other specified body structures: Secondary | ICD-10-CM

## 2014-12-22 MED ORDER — IOHEXOL 300 MG/ML  SOLN
100.0000 mL | Freq: Once | INTRAMUSCULAR | Status: AC | PRN
  Administered 2014-12-22: 100 mL via INTRAVENOUS

## 2015-06-21 ENCOUNTER — Encounter: Payer: Self-pay | Admitting: Gastroenterology

## 2015-08-13 ENCOUNTER — Encounter: Payer: Self-pay | Admitting: Gastroenterology

## 2015-09-09 ENCOUNTER — Encounter: Payer: Self-pay | Admitting: Gastroenterology

## 2015-10-08 ENCOUNTER — Encounter: Payer: Self-pay | Admitting: Gastroenterology

## 2015-10-10 ENCOUNTER — Emergency Department (HOSPITAL_COMMUNITY): Payer: Medicare Other

## 2015-10-10 ENCOUNTER — Emergency Department (HOSPITAL_COMMUNITY)
Admission: EM | Admit: 2015-10-10 | Discharge: 2015-10-10 | Disposition: A | Discharge status: Home / Self Care | Payer: Medicare Other | Attending: Emergency Medicine | Admitting: Emergency Medicine

## 2015-10-10 ENCOUNTER — Encounter (HOSPITAL_COMMUNITY): Payer: Self-pay | Admitting: Emergency Medicine

## 2015-10-10 DIAGNOSIS — R05 Cough: Secondary | ICD-10-CM | POA: Diagnosis present

## 2015-10-10 DIAGNOSIS — F329 Major depressive disorder, single episode, unspecified: Secondary | ICD-10-CM | POA: Insufficient documentation

## 2015-10-10 DIAGNOSIS — Z862 Personal history of diseases of the blood and blood-forming organs and certain disorders involving the immune mechanism: Secondary | ICD-10-CM | POA: Diagnosis not present

## 2015-10-10 DIAGNOSIS — J40 Bronchitis, not specified as acute or chronic: Secondary | ICD-10-CM | POA: Diagnosis not present

## 2015-10-10 DIAGNOSIS — F419 Anxiety disorder, unspecified: Secondary | ICD-10-CM | POA: Insufficient documentation

## 2015-10-10 DIAGNOSIS — Z792 Long term (current) use of antibiotics: Secondary | ICD-10-CM | POA: Diagnosis not present

## 2015-10-10 DIAGNOSIS — Z85820 Personal history of malignant melanoma of skin: Secondary | ICD-10-CM | POA: Insufficient documentation

## 2015-10-10 DIAGNOSIS — I10 Essential (primary) hypertension: Secondary | ICD-10-CM | POA: Insufficient documentation

## 2015-10-10 DIAGNOSIS — H409 Unspecified glaucoma: Secondary | ICD-10-CM | POA: Diagnosis not present

## 2015-10-10 DIAGNOSIS — Z79899 Other long term (current) drug therapy: Secondary | ICD-10-CM | POA: Insufficient documentation

## 2015-10-10 HISTORY — DX: Bronchitis, not specified as acute or chronic: J40

## 2015-10-10 LAB — CBC WITH DIFFERENTIAL/PLATELET
BASOS PCT: 0 %
Basophils Absolute: 0 10*3/uL (ref 0.0–0.1)
Eosinophils Absolute: 0.5 10*3/uL (ref 0.0–0.7)
Eosinophils Relative: 6 %
HCT: 41.3 % (ref 39.0–52.0)
Hemoglobin: 14 g/dL (ref 13.0–17.0)
LYMPHS ABS: 2.8 10*3/uL (ref 0.7–4.0)
Lymphocytes Relative: 32 %
MCH: 32.8 pg (ref 26.0–34.0)
MCHC: 33.9 g/dL (ref 30.0–36.0)
MCV: 96.7 fL (ref 78.0–100.0)
MONO ABS: 0.6 10*3/uL (ref 0.1–1.0)
MONOS PCT: 7 %
Neutro Abs: 4.9 10*3/uL (ref 1.7–7.7)
Neutrophils Relative %: 55 %
Platelets: 216 10*3/uL (ref 150–400)
RBC: 4.27 MIL/uL (ref 4.22–5.81)
RDW: 13.9 % (ref 11.5–15.5)
WBC: 8.9 10*3/uL (ref 4.0–10.5)

## 2015-10-10 LAB — BASIC METABOLIC PANEL
Anion gap: 9 (ref 5–15)
BUN: 18 mg/dL (ref 6–20)
CALCIUM: 9.1 mg/dL (ref 8.9–10.3)
CO2: 22 mmol/L (ref 22–32)
CREATININE: 0.85 mg/dL (ref 0.61–1.24)
Chloride: 109 mmol/L (ref 101–111)
GFR calc non Af Amer: >60 mL/min (ref >60)
Glucose, Bld: 140 mg/dL — ABNORMAL HIGH (ref 65–99)
Potassium: 3.8 mmol/L (ref 3.5–5.1)
SODIUM: 140 mmol/L (ref 135–145)

## 2015-10-10 LAB — BRAIN NATRIURETIC PEPTIDE: B NATRIURETIC PEPTIDE 5: 77.2 pg/mL (ref 0.0–100.0)

## 2015-10-10 MED ORDER — PREDNISONE 50 MG PO TABS: 50.0000 mg | ORAL_TABLET | Freq: Every day | ORAL | Status: DC

## 2015-10-10 MED ORDER — ALBUTEROL SULFATE HFA 108 (90 BASE) MCG/ACT IN AERS
1.0000 | INHALATION_SPRAY | Freq: Four times a day (QID) | RESPIRATORY_TRACT | Status: DC | PRN
  Filled 2015-10-10: qty 6.7

## 2015-10-10 MED ORDER — IPRATROPIUM-ALBUTEROL 0.5-2.5 (3) MG/3ML IN SOLN
3.0000 mL | Freq: Once | RESPIRATORY_TRACT | Status: AC
  Administered 2015-10-10: 3 mL via RESPIRATORY_TRACT
  Filled 2015-10-10: qty 3

## 2015-10-10 NOTE — Discharge Instructions (Signed)
How to Use an Inhaler  Using your inhaler correctly is very important. Good technique will make sure that the medicine reaches your lungs.   HOW TO USE AN INHALER:  · Take the cap off the inhaler.  · If this is the first time using your inhaler, you need to prime it. Shake the inhaler for 5 seconds. Release four puffs into the air, away from your face. Ask your doctor for help if you have questions.  · Shake the inhaler for 5 seconds.  · Turn the inhaler so the bottle is above the mouthpiece.  · Put your pointer finger on top of the bottle. Your thumb holds the bottom of the inhaler.  · Open your mouth.  · Either hold the inhaler away from your mouth (the width of 2 fingers) or place your lips tightly around the mouthpiece. Ask your doctor which way to use your inhaler.  · Breathe out as much air as possible.  · Breathe in and push down on the bottle 1 time to release the medicine. You will feel the medicine go in your mouth and throat.  · Continue to take a deep breath in very slowly. Try to fill your lungs.  · After you have breathed in completely, hold your breath for 10 seconds. This will help the medicine to settle in your lungs. If you cannot hold your breath for 10 seconds, hold it for as long as you can before you breathe out.  · Breathe out slowly, through pursed lips. Whistling is an example of pursed lips.  · If your doctor has told you to take more than 1 puff, wait at least 15-30 seconds between puffs. This will help you get the best results from your medicine. Do not use the inhaler more than your doctor tells you to.  · Put the cap back on the inhaler.  · Follow the directions from your doctor or from the inhaler package about cleaning the inhaler.  If you use more than one inhaler, ask your doctor which inhalers to use and what order to use them in. Ask your doctor to help you figure out when you will need to refill your inhaler.   If you use a steroid inhaler, always rinse your mouth with water  after your last puff, gargle and spit out the water. Do not swallow the water.  GET HELP IF:  · The inhaler medicine only partially helps to stop wheezing or shortness of breath.  · You are having trouble using your inhaler.  · You have some increase in thick spit (phlegm).  GET HELP RIGHT AWAY IF:  · The inhaler medicine does not help your wheezing or shortness of breath or you have tightness in your chest.  · You have dizziness, headaches, or fast heart rate.  · You have chills, fever, or night sweats.  · You have a large increase of thick spit, or your thick spit is bloody.  MAKE SURE YOU:   · Understand these instructions.  · Will watch your condition.  · Will get help right away if you are not doing well or get worse.     This information is not intended to replace advice given to you by your health care provider. Make sure you discuss any questions you have with your health care provider.     Document Released: 05/02/2008 Document Revised: 05/14/2013 Document Reviewed: 02/20/2013  Elsevier Interactive Patient Education ©2016 [employer]  Upper Respiratory Infection, Adult  Most upper   respiratory infections (URIs) are a viral infection of the air passages leading to the lungs. A URI affects the nose, throat, and upper air passages. The most common type of URI is nasopharyngitis and is typically referred to as "the common cold."  URIs run their course and usually go away on their own. Most of the time, a URI does not require medical attention, but sometimes a bacterial infection in the upper airways can follow a viral infection. This is called a secondary infection. Sinus and middle ear infections are common types of secondary upper respiratory infections.  Bacterial pneumonia can also complicate a URI. A URI can worsen asthma and chronic obstructive pulmonary disease (COPD). Sometimes, these complications can require emergency medical care and may be life threatening.   CAUSES  Almost all URIs are caused by  viruses. A virus is a type of germ and can spread from one person to another.   RISKS FACTORS  You may be at risk for a URI if:   · You smoke.    · You have chronic heart or lung disease.  · You have a weakened defense (immune) system.    · You are very young or very old.    · You have nasal allergies or asthma.  · You work in crowded or poorly ventilated areas.  · You work in health care facilities or schools.  SIGNS AND SYMPTOMS   Symptoms typically develop 2-3 days after you come in contact with a cold virus. Most viral URIs last 7-10 days. However, viral URIs from the influenza virus (flu virus) can last 14-18 days and are typically more severe. Symptoms may include:   · Runny or stuffy (congested) nose.    · Sneezing.    · Cough.    · Sore throat.    · Headache.    · Fatigue.    · Fever.    · Loss of appetite.    · Pain in your forehead, behind your eyes, and over your cheekbones (sinus pain).  · Muscle aches.    DIAGNOSIS   Your health care provider may diagnose a URI by:  · Physical exam.  · Tests to check that your symptoms are not due to another condition such as:  ¨ Strep throat.  ¨ Sinusitis.  ¨ Pneumonia.  ¨ Asthma.  TREATMENT   A URI goes away on its own with time. It cannot be cured with medicines, but medicines may be prescribed or recommended to relieve symptoms. Medicines may help:  · Reduce your fever.  · Reduce your cough.  · Relieve nasal congestion.  HOME CARE INSTRUCTIONS   · Take medicines only as directed by your health care provider.    · Gargle warm saltwater or take cough drops to comfort your throat as directed by your health care provider.  · Use a warm mist humidifier or inhale steam from a shower to increase air moisture. This may make it easier to breathe.  · Drink enough fluid to keep your urine clear or pale yellow.    · Eat soups and other clear broths and maintain good nutrition.    · Rest as needed.    · Return to work when your temperature has returned to normal or as your  health care provider advises. You may need to stay home longer to avoid infecting others. You can also use a face mask and careful hand washing to prevent spread of the virus.  · Increase the usage of your inhaler if you have asthma.    · Do not use any tobacco products, including cigarettes,   chewing tobacco, or electronic cigarettes. If you need help quitting, ask your health care provider.  PREVENTION   The best way to protect yourself from getting a cold is to practice good hygiene.   · Avoid oral or hand contact with people with cold symptoms.    · Wash your hands often if contact occurs.    There is no clear evidence that vitamin C, vitamin E, echinacea, or exercise reduces the chance of developing a cold. However, it is always recommended to get plenty of rest, exercise, and practice good nutrition.   SEEK MEDICAL CARE IF:   · You are getting worse rather than better.    · Your symptoms are not controlled by medicine.    · You have chills.  · You have worsening shortness of breath.  · You have brown or red mucus.  · You have yellow or brown nasal discharge.  · You have pain in your face, especially when you bend forward.  · You have a fever.  · You have swollen neck glands.  · You have pain while swallowing.  · You have white areas in the back of your throat.  SEEK IMMEDIATE MEDICAL CARE IF:   · You have severe or persistent:    Headache.    Ear pain.    Sinus pain.    Chest pain.  · You have chronic lung disease and any of the following:    Wheezing.    Prolonged cough.    Coughing up blood.    A change in your usual mucus.  · You have a stiff neck.  · You have changes in your:    Vision.    Hearing.    Thinking.    Mood.  MAKE SURE YOU:   · Understand these instructions.  · Will watch your condition.  · Will get help right away if you are not doing well or get worse.     This information is not intended to replace advice given to you by your health care provider. Make sure you discuss any questions you have  with your health care provider.     Document Released: 01/17/2001 Document Revised: 12/08/2014 Document Reviewed: 10/29/2013  Elsevier Interactive Patient Education ©2016 Elsevier Inc.

## 2015-10-10 NOTE — ED Provider Notes (Signed)
CSN: [id number]     Arrival date & time 10/10/15  0813 History   First MD Initiated Contact with Patient 10/10/15 [phone]     Chief Complaint  Patient presents with  . Cough   HPI Patient presents to the emergency room for difficulties with cough and congestion and clear. About 2 months ago he had an Attack of severe bronchitis. This was the first time this ever happened to him. He was treated with antibiotics and after a couple of weeks symptoms resolved. On Thursday the patient started feeling similar symptoms again. He was having cough and congestion and a sensation of mucus production in his chest. He did not sleep at all that night because whenever he would lie flat he started having difficulty with his breathing. The patient went to medical clinic on Friday. He was given a prescription for Augmentin and was given a shot of steroids. He felt much better Friday evening and slept through the night. Last night the symptoms returned and he started having more difficulty with sleep again. He felt whenever he would lie flat he would have more difficulty with wheezing.  THe cough is non productive.  No fever . No leg swelling. Past Medical History  Diagnosis Date  . Hypertension   . Depression   . Blood transfusion without reported diagnosis     age 3  . Cancer ([facility])     melanoma calf and back  . Glaucoma   . Cataract   . Anemia     as child  . Anxiety attack   . Bronchitis    Past Surgical History  Procedure Laterality Date  . Melanoma excision    . Tonsillectomy      pt unsure  . Colonoscopy    . Polypectomy     Family History  Problem Relation Age of Onset  . Colon cancer Neg Hx   . Rectal cancer Neg Hx   . Stomach cancer Neg Hx   . Depression Mother   . Depression Maternal Grandfather    Social History  Substance Use Topics  . Smoking status: Never Smoker   . Smokeless tobacco: Never Used  . Alcohol Use: 0.0 oz/week    1-2 Glasses of wine per week     Comment: rare-once  every few months maybe    Review of Systems  All other systems reviewed and are negative.     Allergies  Diphenhydramine  Home Medications   Prior to Admission medications   Medication Sig Start Date End Date Taking? Authorizing Provider  allopurinol (ZYLOPRIM) 100 MG tablet Take 200 mg by mouth every morning.    Yes Historical Provider, MD  amoxicillin-clavulanate (AUGMENTIN) 875-125 MG tablet Take 1 tablet by mouth 2 (two) times daily. Started  10/08/15 for 10 days 10/08/15  Yes Historical Provider, MD  buPROPion (WELLBUTRIN XL) 300 MG 24 hr tablet Take 300 mg by mouth daily.   Yes Historical Provider, MD  clonazePAM (KLONOPIN) 0.5 MG tablet Take 0.5-1 mg by mouth at bedtime as needed for anxiety.   Yes Historical Provider, MD  dextromethorphan-guaiFENesin (MUCINEX DM) 30-600 MG 12hr tablet Take 1 tablet by mouth 2 (two) times daily.   Yes Historical Provider, MD  Glucosamine-Chondroitin (GLUCOSAMINE CHONDR COMPLEX PO) Take 1 tablet by mouth daily.   Yes Historical Provider, MD  mirtazapine (REMERON) 7.5 MG tablet Take 7.5-15 mg by mouth at bedtime as needed (for sleep).    Yes Historical Provider, MD  OVER THE COUNTER MEDICATION Take 8  tablets by mouth daily. [place] [employer] and Vitality [place].   Yes Historical Provider, MD  polyethylene glycol (MIRALAX / GLYCOLAX) packet Take 17 g by mouth 2 (two) times daily. 05/18/14  Yes Dorie Rank, MD  travoprost, benzalkonium, (TRAVATAN) 0.004 % ophthalmic solution Place 1 drop into both eyes at bedtime.   Yes Historical Provider, MD  TURMERIC PO Take 1 capsule by mouth daily.   Yes Historical Provider, MD  valsartan (DIOVAN) 80 MG tablet Take 80 mg by mouth every morning.    Yes Historical Provider, MD  predniSONE (DELTASONE) 50 MG tablet Take 1 tablet (50 mg total) by mouth daily. 10/10/15   Dorie Rank, MD   BP 126/75 mmHg  Pulse 69  Temp(Src) 97.9 F (36.6 C) (Oral)  Resp 17  SpO2 98% Physical Exam  Constitutional: He appears well-developed  and well-nourished. No distress.  HENT:  Head: Normocephalic and atraumatic.  Right Ear: External ear normal.  Left Ear: External ear normal.  Eyes: Conjunctivae are normal. Right eye exhibits no discharge. Left eye exhibits no discharge. No scleral icterus.  Neck: Neck supple. No tracheal deviation present.  Cardiovascular: Normal rate, regular rhythm and intact distal pulses.   Pulmonary/Chest: Effort normal and breath sounds normal. No stridor. No respiratory distress. He has no wheezes. He has no rales.  Frequent coughing  Abdominal: Soft. Bowel sounds are normal. He exhibits no distension. There is no tenderness. There is no rebound and no guarding.  Musculoskeletal: He exhibits no edema or tenderness.  Neurological: He is alert. He has normal strength. No cranial nerve deficit (no facial droop, extraocular movements intact, no slurred speech) or sensory deficit. He exhibits normal muscle tone. He displays no seizure activity. Coordination normal.  Skin: Skin is warm and dry. No rash noted.  Psychiatric: He has a normal mood and affect.  Nursing note and vitals reviewed.   ED Course  Procedures (including critical care time) Labs Review Labs Reviewed  BASIC METABOLIC PANEL - Abnormal; Notable for the following:    Glucose, Bld 140 (*)    All other components within normal limits  CBC WITH DIFFERENTIAL/PLATELET  BRAIN NATRIURETIC PEPTIDE    Imaging Review Dg Chest 2 View  10/10/2015  CLINICAL DATA:  Productive cough for 3 days EXAM: CHEST  2 VIEW COMPARISON:  05/06/2013 FINDINGS: The heart size and vascular pattern are normal. No infiltrate or effusion. Degenerative changes thoracic spine stable. IMPRESSION: No active cardiopulmonary disease. Electronically Signed   By: Skipper Cliche M.D.   On: 10/10/2015 08:55   I have personally reviewed and evaluated these images and lab results as part of my medical decision-making.  Medications  albuterol (PROVENTIL HFA;VENTOLIN HFA) 108  (90 Base) MCG/ACT inhaler 1-2 puff (not administered)  ipratropium-albuterol (DUONEB) 0.5-2.5 (3) MG/3ML nebulizer solution 3 mL (3 mLs Nebulization Given 10/10/15 0906)     MDM   Final diagnoses:  Bronchitis    No pna or CHF on xray.  Labs reassuring.  Suspect he is having some bronchospasm and increased mucus production.  Will dc home with steroids and inhlaers.  Follow up with PCP   Dorie Rank, MD 10/10/15 [phone]

## 2015-10-10 NOTE — ED Notes (Signed)
Pt reports 2 months prior having had a severe case of bronchitis that was treated with antibiotics. States Thursday he began feeling similar bronchitis like symptoms again. States he was seen and treated with steroids and antibiotics without improvement. Feels like he can't clear his airway of mucus. No obvious distress in triage, dry cough observed.

## 2015-11-15 ENCOUNTER — Ambulatory Visit (AMBULATORY_SURGERY_CENTER): Payer: Self-pay | Admitting: *Deleted

## 2015-11-15 VITALS — Ht 70.0 in | Wt 220.0 lb

## 2015-11-15 DIAGNOSIS — Z86010 Personal history of colonic polyps: Secondary | ICD-10-CM

## 2015-11-15 MED ORDER — NA SULFATE-K SULFATE-MG SULF 17.5-3.13-1.6 GM/177ML PO SOLN: 1.0000 | Freq: Once | ORAL | Status: DC

## 2015-11-15 NOTE — Progress Notes (Signed)
No egg or soy allergy known to patient  No issues with past sedation with any surgeries  or procedures, no intubation problems  No diet pills per patient No home 02 use per patient  No blood thinners per patient  Pt states issues with constipation but uses miralax 1 capful daily in teh am and 1 1/2 capfuls at night - works great and no issues with constipation using this  emmi Colon video declined

## 2015-11-29 ENCOUNTER — Ambulatory Visit (AMBULATORY_SURGERY_CENTER): Payer: Medicare Other | Admitting: Gastroenterology

## 2015-11-29 ENCOUNTER — Encounter: Payer: Self-pay | Admitting: Gastroenterology

## 2015-11-29 VITALS — BP 120/70 | HR 57 | Temp 98.2°F | Resp 12 | Ht 70.0 in | Wt 220.0 lb

## 2015-11-29 DIAGNOSIS — Z86010 Personal history of colonic polyps: Secondary | ICD-10-CM

## 2015-11-29 MED ORDER — SODIUM CHLORIDE 0.9 % IV SOLN: 500.0000 mL | INTRAVENOUS | Status: DC

## 2015-11-29 NOTE — Progress Notes (Signed)
Report given to PACU RN, vss 

## 2015-11-29 NOTE — Op Note (Signed)
[place] Patient Name: Brian Maxwell Procedure Date: 11/29/2015 8:55 AM MRN: [id number] Endoscopist: Ladene Artist , MD Age: 70 Date of Birth: 03/28/1946 Gender: Male Procedure:                Colonoscopy Indications:              Surveillance: Personal history of adenomatous                            polyps on last colonoscopy > 3 years ago Medicines:                Monitored Anesthesia Care Procedure:                Pre-Anesthesia Assessment:                           - Prior to the procedure, a History and Physical                            was performed, and patient medications and                            allergies were reviewed. The patient's tolerance of                            previous anesthesia was also reviewed. The risks                            and benefits of the procedure and the sedation                            options and risks were discussed with the patient.                            All questions were answered, and informed consent                            was obtained. Prior Anticoagulants: The patient has                            taken no previous anticoagulant or antiplatelet                            agents. ASA Grade Assessment: II - A patient with                            mild systemic disease. After reviewing the risks                            and benefits, the patient was deemed in                            satisfactory condition to undergo the procedure.  After obtaining informed consent, the colonoscope                            was passed under direct vision. Throughout the                            procedure, the patient's blood pressure, pulse, and                            oxygen saturations were monitored continuously. The                            Model PCF-H190DL [id number]) scope was introduced                            through the anus and advanced to the the cecum,                             identified by appendiceal orifice and ileocecal                            valve. The ileocecal valve, appendiceal orifice,                            and rectum were photographed. The quality of the                            bowel preparation was good. The colonoscopy was                            performed without difficulty. The patient tolerated                            the procedure well. Scope In: 9:00:08 AM Scope Out: 9:13:45 AM Scope Withdrawal Time: 0 hours 12 minutes 16 seconds  Total Procedure Duration: 0 hours 13 minutes 37 seconds  Findings:                 Internal hemorrhoids were found during                            retroflexion. The hemorrhoids were small and Grade                            I (internal hemorrhoids that do not prolapse).                           The entire examined colon otherwise appeared normal                            on direct and retroflexion views. Complications:            No immediate complications. Estimated blood loss:  None. Estimated Blood Loss:     Estimated blood loss: none. Impression:               - Internal hemorrhoids.                           - The entire examined colon was otherwise normal on                            direct and retroflexion views. Recommendation:           - Repeat colonoscopy in 5 years for surveillance.                           - Patient has a contact number available for                            emergencies. The signs and symptoms of potential                            delayed complications were discussed with the                            patient. Return to normal activities tomorrow.                            Written discharge instructions were provided to the                            patient.                           - Resume previous diet.                           - Continue present medications. Ladene Artist, MD 11/29/2015 9:17:52  AM This report has been signed electronically.

## 2015-11-29 NOTE — Patient Instructions (Signed)
YOU HAD AN ENDOSCOPIC PROCEDURE TODAY AT [facility]:   Refer to the procedure report that was given to you for any specific questions about what was found during the examination.  If the procedure report does not answer your questions, please call your gastroenterologist to clarify.  If you requested that your care partner not be given the details of your procedure findings, then the procedure report has been included in a sealed envelope for you to review at your convenience later.  YOU SHOULD EXPECT: Some feelings of bloating in the abdomen. Passage of more gas than usual.  Walking can help get rid of the air that was put into your GI tract during the procedure and reduce the bloating. If you had a lower endoscopy (such as a colonoscopy or flexible sigmoidoscopy) you may notice spotting of blood in your stool or on the toilet paper. If you underwent a bowel prep for your procedure, you may not have a normal bowel movement for a few days.  Please Note:  You might notice some irritation and congestion in your nose or some drainage.  This is from the oxygen used during your procedure.  There is no need for concern and it should clear up in a day or so.  SYMPTOMS TO REPORT IMMEDIATELY:   Following lower endoscopy (colonoscopy or flexible sigmoidoscopy):  Excessive amounts of blood in the stool  Significant tenderness or worsening of abdominal pains  Swelling of the abdomen that is new, acute  Fever of 100F or higher   For urgent or emergent issues, a gastroenterologist can be reached at any hour by calling [phone].   DIET: Your first meal following the procedure should be a small meal and then it is ok to progress to your normal diet. Heavy or fried foods are harder to digest and may make you feel nauseous or bloated.  Likewise, meals heavy in dairy and vegetables can increase bloating.  Drink plenty of fluids but you should avoid alcoholic beverages for 24  hours.  ACTIVITY:  You should plan to take it easy for the rest of today and you should NOT DRIVE or use heavy machinery until tomorrow (because of the sedation medicines used during the test).    FOLLOW UP: Our staff will call the number listed on your records the next business day following your procedure to check on you and address any questions or concerns that you may have regarding the information given to you following your procedure. If we do not reach you, we will leave a message.  However, if you are feeling well and you are not experiencing any problems, there is no need to return our call.  We will assume that you have returned to your regular daily activities without incident.  If any biopsies were taken you will be contacted by phone or by letter within the next 1-3 weeks.  Please call us at [phone] if you have not heard about the biopsies in 3 weeks.    SIGNATURES/CONFIDENTIALITY: You and/or your care partner have signed paperwork which will be entered into your electronic medical record.  These signatures attest to the fact that that the information above on your After Visit Summary has been reviewed and is understood.  Full responsibility of the confidentiality of this discharge information lies with you and/or your care-partner.  Thank-you for choosing [place] for your healthcare needs today.

## 2015-11-30 ENCOUNTER — Telehealth: Payer: Self-pay | Admitting: *Deleted

## 2015-11-30 NOTE — Telephone Encounter (Signed)
Message left

## 2016-02-02 ENCOUNTER — Ambulatory Visit: Payer: Self-pay | Admitting: Surgery

## 2016-02-02 NOTE — H&P (Signed)
History of Present Illness Brian Maxwell. Brian Stucki MD; 02/02/2016 5:26 PM) Patient words: keloid medical chest.  The patient is a 70 year old male who presents with a complaint of Mass. Referred by Brian Alken, PA-C [facility] for "infected keloid - chest"  This is a 70 year old male who is about 20 years status post cryoablation of a precancerous lesion in the center of his chest over the sternum. This was performed by a dermatologist. The patient developed a large keloid lesion in this area. Over the last 10 years he has had intermittent swelling followed by spontaneous rupture and drainage of purulent fluid from the center of this keloid. This happens about every 6 months. Recently he has become more frequent. He does not have any surrounding cellulitis. Does not spread to other parts of his chest. He does state that when the area is quite swollen and inflamed he also notices pain in his right hip but is unclear whether this is related. He presents now to discuss excision of this large lesion on his chest.   Other Problems (Brian Eversole, LPN; 075-GRM QA348G PM) Anxiety Disorder Depression High blood pressure Kidney Stone Melanoma  Past Surgical History Brian Borer, LPN; 075-GRM QA348G PM) Foot Surgery Bilateral. Oral Surgery  Diagnostic Studies History (Brian Eversole, LPN; 075-GRM QA348G PM) Colonoscopy within last year  Allergies (Brian Eversole, LPN; 075-GRM 579FGE PM) DiphenhydrAMINE HCl (Sleep) *HYPNOTICS/SEDATIVES/SLEEP DISORDER AGENTS*  Medication History (Brian Eversole, LPN; 075-GRM [id number] PM) ClonazePAM (0.5MG  Tablet, Oral) Active. Allopurinol (100MG  Tablet, Oral) Active. BuPROPion HCl ER (XL) (300MG  Tablet ER 24HR, Oral) Active. Mirtazapine (15MG  Tablet, Oral) Active. Travatan Z (0.004% Solution, Ophthalmic) Active. Valsartan (80MG  Tablet, Oral) Active. Glucosamine-Chondroitin (250-200MG  Capsule, Oral) Active. Calcium & Magnesium  Carbonates (311-232MG  Capsule, Oral) Active. CoQ10 (100MG  Capsule, Oral) Active. Ginkgo Biloba (100MG  Capsule, Oral) Active. Krill Oil (1000MG  Capsule, Oral) Active. Multiple Vitamin (Oral) Active. MiraLax (Oral) Active. Medications Reconciled  Social History Brian Borer, LPN; 075-GRM QA348G PM) Alcohol use Occasional alcohol use. Caffeine use Coffee. No drug use Tobacco use Never smoker.  Family History Brian Borer, LPN; 075-GRM QA348G PM) Depression Daughter, Mother, Sister. Family history unknown First Degree Relatives Heart Disease Mother. Hypertension Mother. Melanoma Sister.     Review of Systems (Brian Eversole LPN; 075-GRM QA348G PM) General Present- Fatigue. Not Present- Appetite Loss, Chills, Fever, Night Sweats, Weight Gain and Weight Loss. Skin Present- Change in Wart/Mole and Non-Healing Wounds. Not Present- Dryness, Hives, Jaundice, New Lesions, Rash and Ulcer. HEENT Present- Earache, Hearing Loss, Ringing in the Ears and Wears glasses/contact lenses. Not Present- Hoarseness, Nose Bleed, Oral Ulcers, Seasonal Allergies, Sinus Pain, Sore Throat, Visual Disturbances and Yellow Eyes. Respiratory Present- Snoring. Not Present- Bloody sputum, Chronic Cough, Difficulty Breathing and Wheezing. Cardiovascular Present- Leg Cramps. Not Present- Chest Pain, Difficulty Breathing Lying Down, Palpitations, Rapid Heart Rate, Shortness of Breath and Swelling of Extremities. Gastrointestinal Present- Constipation. Not Present- Abdominal Pain, Bloating, Bloody Stool, Change in Bowel Habits, Chronic diarrhea, Difficulty Swallowing, Excessive gas, Gets full quickly at meals, Hemorrhoids, Indigestion, Nausea, Rectal Pain and Vomiting. Male Genitourinary Present- Frequency. Not Present- Blood in Urine, Change in Urinary Stream, Impotence, Nocturia, Painful Urination, Urgency and Urine Leakage. Musculoskeletal Present- Joint Pain. Not Present- Back Pain, Joint  Stiffness, Muscle Pain, Muscle Weakness and Swelling of Extremities. Psychiatric Present- Anxiety and Depression. Not Present- Bipolar, Change in Sleep Pattern, Fearful and Frequent crying. Hematology Present- Persistent Infections. Not Present- Blood Thinners, Easy Bruising, Excessive bleeding, Gland problems and HIV.  Vitals (Brian Eversole  LPN; 075-GRM 579FGE PM) 02/02/2016 2:35 PM Weight: 226.4 lb Height: 70in Body Surface Area: 2.2 m Body Mass Index: 32.48 kg/m  Temp.: 98.37F(Oral)  BP: 152/84 (Sitting, Left Arm, Standard)      Physical Exam Brian Key K. Aariel Ems MD; 02/02/2016 5:27 PM)  The physical exam findings are as follows: Note:WDWN in NAD HEENT: EOMI, sclera anicteric Neck: No masses, no thyromegaly Lungs: CTA bilaterally; normal respiratory effort Chest - overlying sternum - 4 x 3 cm keloid lesion with central eschar with some minimal drainage; no surrounding cellulitis CV: Regular rate and rhythm; no murmurs Abd: +bowel sounds, soft, non-tender, no masses Ext: Well-perfused; no edema Skin: Warm, dry; no sign of jaundice    Assessment & Plan Brian Key K. 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 The surgical procedure has been discussed with the patient. Potential risks, benefits, alternative treatments, and expected outcomes have been explained. All of the patient's questions at this time have been answered. The likelihood of reaching the patient's treatment goal is good. The patient understand the proposed surgical procedure and wishes to proceed.  Brian Maxwell. Brian Dover, MD, [facility] Surgery  General/ Trauma Surgery  02/02/2016 5:28 PM

## 2016-02-11 ENCOUNTER — Other Ambulatory Visit: Payer: Self-pay | Admitting: Physician Assistant

## 2016-02-11 DIAGNOSIS — R42 Dizziness and giddiness: Secondary | ICD-10-CM

## 2016-02-14 ENCOUNTER — Other Ambulatory Visit: Payer: Self-pay | Admitting: Physician Assistant

## 2016-02-14 DIAGNOSIS — R0989 Other specified symptoms and signs involving the circulatory and respiratory systems: Secondary | ICD-10-CM

## 2016-02-17 ENCOUNTER — Other Ambulatory Visit: Payer: Self-pay

## 2016-02-21 ENCOUNTER — Encounter (HOSPITAL_COMMUNITY): Payer: Self-pay

## 2016-02-22 ENCOUNTER — Encounter (HOSPITAL_COMMUNITY): Payer: Self-pay

## 2016-02-22 ENCOUNTER — Encounter (HOSPITAL_COMMUNITY)
Admission: RE | Admit: 2016-02-22 | Discharge: 2016-02-22 | Disposition: A | Discharge status: Home / Self Care | Payer: Medicare Other | Source: Ambulatory Visit | Attending: Surgery | Admitting: Surgery

## 2016-02-22 DIAGNOSIS — E669 Obesity, unspecified: Secondary | ICD-10-CM | POA: Diagnosis not present

## 2016-02-22 DIAGNOSIS — Z85820 Personal history of malignant melanoma of skin: Secondary | ICD-10-CM | POA: Diagnosis not present

## 2016-02-22 DIAGNOSIS — L910 Hypertrophic scar: Secondary | ICD-10-CM | POA: Diagnosis not present

## 2016-02-22 DIAGNOSIS — F419 Anxiety disorder, unspecified: Secondary | ICD-10-CM | POA: Diagnosis not present

## 2016-02-22 DIAGNOSIS — I10 Essential (primary) hypertension: Secondary | ICD-10-CM | POA: Diagnosis not present

## 2016-02-22 LAB — BASIC METABOLIC PANEL
ANION GAP: 6 (ref 5–15)
BUN: 10 mg/dL (ref 6–20)
CO2: 28 mmol/L (ref 22–32)
Calcium: 9.6 mg/dL (ref 8.9–10.3)
Chloride: 105 mmol/L (ref 101–111)
Creatinine, Ser: 0.99 mg/dL (ref 0.61–1.24)
GFR calc Af Amer: >60 mL/min (ref >60)
GLUCOSE: 84 mg/dL (ref 65–99)
POTASSIUM: 4.4 mmol/L (ref 3.5–5.1)
Sodium: 139 mmol/L (ref 135–145)

## 2016-02-22 LAB — CBC
HEMATOCRIT: 41.4 % (ref 39.0–52.0)
HEMOGLOBIN: 14.1 g/dL (ref 13.0–17.0)
MCH: 32.5 pg (ref 26.0–34.0)
MCHC: 34.1 g/dL (ref 30.0–36.0)
MCV: 95.4 fL (ref 78.0–100.0)
Platelets: 229 10*3/uL (ref 150–400)
RBC: 4.34 MIL/uL (ref 4.22–5.81)
RDW: 12.9 % (ref 11.5–15.5)
WBC: 8 10*3/uL (ref 4.0–10.5)

## 2016-02-22 NOTE — Pre-Procedure Instructions (Signed)
JABAREE STRANTZ  02/22/2016      [facility] - [place], [address] [place] [address] Phone: [phone] Fax: [phone]  [id number] - [place], [address] [place] [address] Phone: [phone] Fax: [phone]    Your procedure is scheduled on  02/23/2016  Report to [facility] Admitting at 09:30 A.M.  Call this number if you have problems the morning of surgery:  [phone]   Remember:  Do not eat food or drink liquids after midnight.  On Tuesday   Take these medicines the morning of surgery with A SIP OF WATER : allopurinol, wellbutrin, klonopin   Do not wear jewelry   Do not wear lotions, powders, or perfumes.  You may not  wear deoderant.    Men may shave face and neck.   Do not bring valuables to the hospital.   [facility] is not responsible for any belongings or valuables.  Contacts, dentures or bridgework may not be worn into surgery.  Leave your suitcase in the car.  After surgery it may be brought to your room.  For patients admitted to the hospital, discharge time will be determined by your treatment team.  Patients discharged the day of surgery will not be allowed to drive home.   Name and phone number of your driver:  /w wife   Special instructions:  Special Instructions: [facility] - Preparing for Surgery  Before surgery, you can play an important role.  Because skin is not sterile, your skin needs to be as free of germs as possible.  You can reduce the number of germs on you skin by washing with CHG (chlorahexidine gluconate) soap before surgery.  CHG is an antiseptic cleaner which kills germs and bonds with the skin to continue killing germs even after washing.  Please DO NOT use if you have an allergy to CHG or antibacterial soaps.  If your skin becomes reddened/irritated stop using the CHG and inform your  nurse when you arrive at Short Stay.  Do not shave (including legs and underarms) for at least 48 hours prior to the first CHG shower.  You may shave your face.  Please follow these instructions carefully:   1.  Shower with CHG Soap the night before surgery and the  morning of Surgery.  2.  If you choose to wash your hair, wash your hair first as usual with your  normal shampoo.  3.  After you shampoo, rinse your hair and body thoroughly to remove the  Shampoo.  4.  Use CHG as you would any other liquid soap.  You can apply chg directly to the skin and wash gently with scrungie or a clean washcloth.  5.  Apply the CHG Soap to your body ONLY FROM THE NECK DOWN.    Do not use on open wounds or open sores.  Avoid contact with your eyes, ears, mouth and genitals (private parts).  Wash genitals (private parts)   with your normal soap.  6.  Wash thoroughly, paying special attention to the area where your surgery will be performed.  7.  Thoroughly rinse your body with warm water from the neck down.  8.  DO NOT shower/wash with your normal soap after using and rinsing off   the CHG Soap.  9.  Pat yourself dry with a clean towel.  10.  Wear clean pajamas.            11.  Place clean sheets on your bed the night of your first shower and do not sleep with pets.  Day of Surgery  Do not apply any lotions/deodorants the morning of surgery.  Please wear clean clothes to the hospital/surgery center.  Please read over the following fact sheets that you were given. Pain Booklet, Coughing and Deep Breathing and Surgical Site Infection Prevention

## 2016-02-22 NOTE — Progress Notes (Signed)
Pt. Denies need for cardiac care in the past or present , states an EKG was done w/i the past several moths, told that it was wnl. Followed at [facility] & Dr. Jenel Lucks for PCP. Faxing request for EKG fr,. [facility].

## 2016-02-23 ENCOUNTER — Ambulatory Visit (HOSPITAL_COMMUNITY)
Admission: RE | Admit: 2016-02-23 | Discharge: 2016-02-23 | Disposition: A | Discharge status: Home / Self Care | Payer: Medicare Other | Source: Ambulatory Visit | Attending: Surgery | Admitting: Surgery

## 2016-02-23 ENCOUNTER — Encounter (HOSPITAL_COMMUNITY): Payer: Self-pay | Admitting: *Deleted

## 2016-02-23 ENCOUNTER — Ambulatory Visit (HOSPITAL_COMMUNITY): Payer: Medicare Other | Admitting: Certified Registered Nurse Anesthetist

## 2016-02-23 ENCOUNTER — Encounter (HOSPITAL_COMMUNITY)
Admission: RE | Disposition: A | Discharge status: Home / Self Care | Payer: Self-pay | Source: Ambulatory Visit | Attending: Surgery

## 2016-02-23 DIAGNOSIS — Z85820 Personal history of malignant melanoma of skin: Secondary | ICD-10-CM | POA: Insufficient documentation

## 2016-02-23 DIAGNOSIS — I10 Essential (primary) hypertension: Secondary | ICD-10-CM | POA: Insufficient documentation

## 2016-02-23 DIAGNOSIS — F419 Anxiety disorder, unspecified: Secondary | ICD-10-CM | POA: Diagnosis not present

## 2016-02-23 DIAGNOSIS — E669 Obesity, unspecified: Secondary | ICD-10-CM | POA: Diagnosis not present

## 2016-02-23 DIAGNOSIS — L910 Hypertrophic scar: Secondary | ICD-10-CM | POA: Diagnosis not present

## 2016-02-23 HISTORY — PX: EXCISION OF KELOID: SHX6267

## 2016-02-23 SURGERY — EXCISION, KELOID
Anesthesia: General | Site: Chest

## 2016-02-23 MED ORDER — LIDOCAINE 2% (20 MG/ML) 5 ML SYRINGE
INTRAMUSCULAR | Status: AC
  Filled 2016-02-23: qty 5

## 2016-02-23 MED ORDER — LACTATED RINGERS IV SOLN
INTRAVENOUS | Status: DC
  Administered 2016-02-23: 10:00:00 via INTRAVENOUS

## 2016-02-23 MED ORDER — OXYCODONE-ACETAMINOPHEN 5-325 MG PO TABS: 1.0000 | ORAL_TABLET | ORAL | Status: DC | PRN

## 2016-02-23 MED ORDER — PROPOFOL 10 MG/ML IV BOLUS
INTRAVENOUS | Status: DC | PRN
  Administered 2016-02-23: 200 mg via INTRAVENOUS

## 2016-02-23 MED ORDER — CHLORHEXIDINE GLUCONATE CLOTH 2 % EX PADS: 6.0000 | MEDICATED_PAD | Freq: Once | CUTANEOUS | Status: DC

## 2016-02-23 MED ORDER — MORPHINE SULFATE (PF) 2 MG/ML IV SOLN: 2.0000 mg | INTRAVENOUS | Status: DC | PRN

## 2016-02-23 MED ORDER — ONDANSETRON HCL 4 MG/2ML IJ SOLN
4.0000 mg | INTRAMUSCULAR | Status: DC | PRN
  Filled 2016-02-23: qty 2

## 2016-02-23 MED ORDER — PHENYLEPHRINE HCL 10 MG/ML IJ SOLN
INTRAMUSCULAR | Status: DC | PRN
  Administered 2016-02-23: 40 ug via INTRAVENOUS

## 2016-02-23 MED ORDER — PROPOFOL 10 MG/ML IV BOLUS
INTRAVENOUS | Status: AC
  Filled 2016-02-23: qty 20

## 2016-02-23 MED ORDER — ONDANSETRON HCL 4 MG/2ML IJ SOLN
INTRAMUSCULAR | Status: AC
  Filled 2016-02-23: qty 2

## 2016-02-23 MED ORDER — ONDANSETRON HCL 4 MG/2ML IJ SOLN
INTRAMUSCULAR | Status: DC | PRN
  Administered 2016-02-23: 4 mg via INTRAVENOUS

## 2016-02-23 MED ORDER — FENTANYL CITRATE (PF) 100 MCG/2ML IJ SOLN: 25.0000 ug | INTRAMUSCULAR | Status: DC | PRN

## 2016-02-23 MED ORDER — CEFAZOLIN SODIUM-DEXTROSE 2-4 GM/100ML-% IV SOLN
2.0000 g | INTRAVENOUS | Status: AC
  Administered 2016-02-23: 2 g via INTRAVENOUS
  Filled 2016-02-23: qty 100

## 2016-02-23 MED ORDER — FENTANYL CITRATE (PF) 100 MCG/2ML IJ SOLN
INTRAMUSCULAR | Status: DC | PRN
  Administered 2016-02-23 (×4): 50 ug via INTRAVENOUS

## 2016-02-23 MED ORDER — FENTANYL CITRATE (PF) 250 MCG/5ML IJ SOLN
INTRAMUSCULAR | Status: AC
  Filled 2016-02-23: qty 5

## 2016-02-23 MED ORDER — BUPIVACAINE-EPINEPHRINE 0.25% -1:200000 IJ SOLN
INTRAMUSCULAR | Status: DC | PRN
  Administered 2016-02-23: 20 mL

## 2016-02-23 MED ORDER — MIDAZOLAM HCL 5 MG/5ML IJ SOLN
INTRAMUSCULAR | Status: DC | PRN
  Administered 2016-02-23: 1 mg via INTRAVENOUS

## 2016-02-23 MED ORDER — BUPIVACAINE-EPINEPHRINE (PF) 0.25% -1:200000 IJ SOLN
INTRAMUSCULAR | Status: AC
  Filled 2016-02-23: qty 30

## 2016-02-23 MED ORDER — LIDOCAINE HCL (CARDIAC) 20 MG/ML IV SOLN
INTRAVENOUS | Status: DC | PRN
  Administered 2016-02-23: 100 mg via INTRAVENOUS

## 2016-02-23 MED ORDER — MIDAZOLAM HCL 2 MG/2ML IJ SOLN
INTRAMUSCULAR | Status: AC
  Filled 2016-02-23: qty 2

## 2016-02-23 MED ORDER — ONDANSETRON HCL 4 MG/2ML IJ SOLN: 4.0000 mg | Freq: Once | INTRAMUSCULAR | Status: DC | PRN

## 2016-02-23 SURGICAL SUPPLY — 49 items
APL SKNCLS STERI-STRIP NONHPOA (GAUZE/BANDAGES/DRESSINGS) ×1
BENZOIN TINCTURE PRP APPL 2/3 (GAUZE/BANDAGES/DRESSINGS) ×2 IMPLANT
BLADE SURG ROTATE 9660 (MISCELLANEOUS) ×2 IMPLANT
CHLORAPREP W/TINT 26ML (MISCELLANEOUS) ×2 IMPLANT
CLOSURE WOUND 1/2 X4 (GAUZE/BANDAGES/DRESSINGS) ×1
COVER SURGICAL LIGHT HANDLE (MISCELLANEOUS) ×3 IMPLANT
DRAPE LAPAROTOMY T 98X78 PEDS (DRAPES) ×3 IMPLANT
DRAPE UTILITY XL STRL (DRAPES) ×3 IMPLANT
DRSG COVADERM 4X10 (GAUZE/BANDAGES/DRESSINGS) ×2 IMPLANT
DRSG COVADERM 4X14 (GAUZE/BANDAGES/DRESSINGS) IMPLANT
DRSG TEGADERM 4X4.75 (GAUZE/BANDAGES/DRESSINGS) IMPLANT
ELECT CAUTERY BLADE 6.4 (BLADE) ×3 IMPLANT
ELECT REM PT RETURN 9FT ADLT (ELECTROSURGICAL) ×3
ELECTRODE REM PT RTRN 9FT ADLT (ELECTROSURGICAL) ×1 IMPLANT
GAUZE SPONGE 4X4 12PLY STRL (GAUZE/BANDAGES/DRESSINGS) IMPLANT
GLOVE BIO SURGEON STRL SZ7 (GLOVE) ×3 IMPLANT
GLOVE BIO SURGEON STRL SZ8 (GLOVE) ×2 IMPLANT
GLOVE BIOGEL PI IND STRL 6.5 (GLOVE) IMPLANT
GLOVE BIOGEL PI IND STRL 7.5 (GLOVE) ×1 IMPLANT
GLOVE BIOGEL PI IND STRL 8.5 (GLOVE) IMPLANT
GLOVE BIOGEL PI INDICATOR 6.5 (GLOVE) ×2
GLOVE BIOGEL PI INDICATOR 7.5 (GLOVE) ×2
GLOVE BIOGEL PI INDICATOR 8.5 (GLOVE) ×2
GOWN STRL REUS W/ TWL LRG LVL3 (GOWN DISPOSABLE) ×2 IMPLANT
GOWN STRL REUS W/ TWL XL LVL3 (GOWN DISPOSABLE) IMPLANT
GOWN STRL REUS W/TWL LRG LVL3 (GOWN DISPOSABLE) ×3
GOWN STRL REUS W/TWL XL LVL3 (GOWN DISPOSABLE) ×3
KIT BASIN OR (CUSTOM PROCEDURE TRAY) ×3 IMPLANT
KIT ROOM TURNOVER OR (KITS) ×3 IMPLANT
NDL HYPO 25GX1X1/2 BEV (NEEDLE) ×1 IMPLANT
NEEDLE HYPO 25GX1X1/2 BEV (NEEDLE) ×3 IMPLANT
NS IRRIG 1000ML POUR BTL (IV SOLUTION) ×3 IMPLANT
PACK SURGICAL SETUP 50X90 (CUSTOM PROCEDURE TRAY) ×3 IMPLANT
PAD ARMBOARD 7.5X6 YLW CONV (MISCELLANEOUS) ×3 IMPLANT
PENCIL BUTTON HOLSTER BLD 10FT (ELECTRODE) ×3 IMPLANT
SPECIMEN JAR SMALL (MISCELLANEOUS) ×3 IMPLANT
SPONGE LAP 18X18 X RAY DECT (DISPOSABLE) ×3 IMPLANT
STRIP CLOSURE SKIN 1/2X4 (GAUZE/BANDAGES/DRESSINGS) ×1 IMPLANT
SUT MNCRL AB 3-0 PS2 18 (SUTURE) ×2 IMPLANT
SUT MNCRL AB 4-0 PS2 18 (SUTURE) ×1 IMPLANT
SUT VIC AB 2-0 SH 18 (SUTURE) ×4 IMPLANT
SUT VIC AB 2-0 SH 27 (SUTURE)
SUT VIC AB 2-0 SH 27X BRD (SUTURE) ×1 IMPLANT
SUT VIC AB 3-0 SH 18 (SUTURE) ×4 IMPLANT
SUT VIC AB 3-0 SH 27 (SUTURE)
SUT VIC AB 3-0 SH 27XBRD (SUTURE) ×1 IMPLANT
SYR CONTROL 10ML LL (SYRINGE) ×3 IMPLANT
TOWEL OR 17X24 6PK STRL BLUE (TOWEL DISPOSABLE) ×3 IMPLANT
TOWEL OR 17X26 10 PK STRL BLUE (TOWEL DISPOSABLE) ×3 IMPLANT

## 2016-02-23 NOTE — Op Note (Signed)
Preop diagnosis: Large keloid anterior chest Postop diagnosis: Same Procedure performed: Wide excision of large keloid anterior chest (total length of incision 15 cm, dimensions of keloid scar 7 x 5 cm) Surgeon:Damian Hofstra K. Anesthesia: Gen. via LMA Indications: This is a 70 year old male who is 20 years status post cryoablation of a precancerous lesion center of his chest over the sternum. The patient developed a large keloid in this area. Of the last several years he has had spontaneous rupture and drainage of purulent fluid from the center of this keloid. This occurs couple times a month. Does not have any surrounding saline is. He presents now for wide excision of this area.  Description of procedure: The patient is brought to the operating room placed in supine position on operating room table. After an adequate level general anesthesia was obtained the patient's chest was prepped with ChloraPrep and draped sterile fashion. A timeout was taken to ensure the proper patient and proper procedure. We infiltrated the area around the large keloid lesion with 0.25% Marcaine with epinephrine. We outlined a long elliptical incision around the entire keloid taking minimum 1 cm margins.  We made our incision and dissected through the dermis steno subcutaneous tissue with cautery. We excised the entire specimen with cautery down to the fascia. We oriented the specimen with a suture superior. This was sent for pathologic examination. We then undermined the skin asepsis tissue on each side of the decision for distance of about 6 cm. This allowed some mobilization medially of the large soft tissue and skin defect. This defect measured 15 cm long by 9 cm wide. Once we had adequately undermined we are up to reapproximate the skin edges. We closed the wound with 2 deep layers of interrupted 2-0 Vicryl sutures. This allowed close approximation of the dermis. 3-0 Monocryl was used to close the skin is elliptical fashion.  Benzoin Steri-Strips are used to seal the wound. An occlusive dressing was applied. The patient was then asked patient brought to the recovery room in stable condition. All sponge, instrument, and needle counts are correct.  Imogene Burn. Georgette Dover, MD, [facility]  General/ Trauma Surgery  02/23/2016 12:48 PM

## 2016-02-23 NOTE — Transfer of Care (Signed)
Immediate Anesthesia Transfer of Care Note  Patient: Brian Maxwell  Procedure(s) Performed: Procedure(s): EXCISION CHEST WALL KELOID (N/A)  Patient Location: PACU  Anesthesia Type:General  Level of Consciousness: awake, alert , oriented and patient cooperative  Airway & Oxygen Therapy: Patient Spontanous Breathing and Patient connected to nasal cannula oxygen  Post-op Assessment: Report given to RN, Post -op Vital signs reviewed and stable and Patient moving all extremities  Post vital signs: Reviewed and stable  Last Vitals:  Filed Vitals:   02/23/16 0945  BP: 128/87  Pulse: 65  Temp: 36.6 C  Resp: 20    Last Pain: There were no vitals filed for this visit.       Complications: No apparent anesthesia complications

## 2016-02-23 NOTE — Anesthesia Procedure Notes (Signed)
Procedure Name: LMA Insertion Date/Time: 02/23/2016 11:30 AM Performed by: Shirlyn Goltz Pre-anesthesia Checklist: Emergency Drugs available, Suction available, Patient being monitored and Patient identified Patient Re-evaluated:Patient Re-evaluated prior to inductionOxygen Delivery Method: Circle system utilized Intubation Type: IV induction Ventilation: Mask ventilation without difficulty LMA: LMA inserted LMA Size: 5.0 Number of attempts: 1 Placement Confirmation: positive ETCO2 and breath sounds checked- equal and bilateral Tube secured with: Tape Dental Injury: Teeth and Oropharynx as per pre-operative assessment

## 2016-02-23 NOTE — Interval H&P Note (Signed)
History and Physical Interval Note:  02/23/2016 11:00 AM  Lyn Hollingshead  has presented today for surgery, with the diagnosis of Large keloid sternal region  The various methods of treatment have been discussed with the patient and family. After consideration of risks, benefits and other options for treatment, the patient has consented to  Procedure(s): EXCISION CHEST WALL KELOID (N/A) as a surgical intervention .  The patient's history has been reviewed, patient examined, no change in status, stable for surgery.  I have reviewed the patient's chart and labs.  Questions were answered to the patient's satisfaction.     Char Feltman K.

## 2016-02-23 NOTE — H&P (View-Only) (Signed)
History of Present Illness Brian Maxwell. Tilia Faso MD; 02/02/2016 5:26 PM) Patient words: keloid medical chest.  The patient is a 70 year old male who presents with a complaint of Mass. Referred by Gaye Alken, PA-C [facility] for "infected keloid - chest"  This is a 70 year old male who is about 20 years status post cryoablation of a precancerous lesion in the center of his chest over the sternum. This was performed by a dermatologist. The patient developed a large keloid lesion in this area. Over the last 10 years he has had intermittent swelling followed by spontaneous rupture and drainage of purulent fluid from the center of this keloid. This happens about every 6 months. Recently he has become more frequent. He does not have any surrounding cellulitis. Does not spread to other parts of his chest. He does state that when the area is quite swollen and inflamed he also notices pain in his right hip but is unclear whether this is related. He presents now to discuss excision of this large lesion on his chest.   Other Problems (Ammie Eversole, LPN; 075-GRM QA348G PM) Anxiety Disorder Depression High blood pressure Kidney Stone Melanoma  Past Surgical History Aleatha Borer, LPN; 075-GRM QA348G PM) Foot Surgery Bilateral. Oral Surgery  Diagnostic Studies History (Ammie Eversole, LPN; 075-GRM QA348G PM) Colonoscopy within last year  Allergies (Ammie Eversole, LPN; 075-GRM 579FGE PM) DiphenhydrAMINE HCl (Sleep) *HYPNOTICS/SEDATIVES/SLEEP DISORDER AGENTS*  Medication History (Ammie Eversole, LPN; 075-GRM [id number] PM) ClonazePAM (0.5MG  Tablet, Oral) Active. Allopurinol (100MG  Tablet, Oral) Active. BuPROPion HCl ER (XL) (300MG  Tablet ER 24HR, Oral) Active. Mirtazapine (15MG  Tablet, Oral) Active. Travatan Z (0.004% Solution, Ophthalmic) Active. Valsartan (80MG  Tablet, Oral) Active. Glucosamine-Chondroitin (250-200MG  Capsule, Oral) Active. Calcium & Magnesium  Carbonates (311-232MG  Capsule, Oral) Active. CoQ10 (100MG  Capsule, Oral) Active. Ginkgo Biloba (100MG  Capsule, Oral) Active. Krill Oil (1000MG  Capsule, Oral) Active. Multiple Vitamin (Oral) Active. MiraLax (Oral) Active. Medications Reconciled  Social History Aleatha Borer, LPN; 075-GRM QA348G PM) Alcohol use Occasional alcohol use. Caffeine use Coffee. No drug use Tobacco use Never smoker.  Family History Aleatha Borer, LPN; 075-GRM QA348G PM) Depression Daughter, Mother, Sister. Family history unknown First Degree Relatives Heart Disease Mother. Hypertension Mother. Melanoma Sister.     Review of Systems (Ammie Eversole LPN; 075-GRM QA348G PM) General Present- Fatigue. Not Present- Appetite Loss, Chills, Fever, Night Sweats, Weight Gain and Weight Loss. Skin Present- Change in Wart/Mole and Non-Healing Wounds. Not Present- Dryness, Hives, Jaundice, New Lesions, Rash and Ulcer. HEENT Present- Earache, Hearing Loss, Ringing in the Ears and Wears glasses/contact lenses. Not Present- Hoarseness, Nose Bleed, Oral Ulcers, Seasonal Allergies, Sinus Pain, Sore Throat, Visual Disturbances and Yellow Eyes. Respiratory Present- Snoring. Not Present- Bloody sputum, Chronic Cough, Difficulty Breathing and Wheezing. Cardiovascular Present- Leg Cramps. Not Present- Chest Pain, Difficulty Breathing Lying Down, Palpitations, Rapid Heart Rate, Shortness of Breath and Swelling of Extremities. Gastrointestinal Present- Constipation. Not Present- Abdominal Pain, Bloating, Bloody Stool, Change in Bowel Habits, Chronic diarrhea, Difficulty Swallowing, Excessive gas, Gets full quickly at meals, Hemorrhoids, Indigestion, Nausea, Rectal Pain and Vomiting. Male Genitourinary Present- Frequency. Not Present- Blood in Urine, Change in Urinary Stream, Impotence, Nocturia, Painful Urination, Urgency and Urine Leakage. Musculoskeletal Present- Joint Pain. Not Present- Back Pain, Joint  Stiffness, Muscle Pain, Muscle Weakness and Swelling of Extremities. Psychiatric Present- Anxiety and Depression. Not Present- Bipolar, Change in Sleep Pattern, Fearful and Frequent crying. Hematology Present- Persistent Infections. Not Present- Blood Thinners, Easy Bruising, Excessive bleeding, Gland problems and HIV.  Vitals (Ammie Eversole  LPN; 075-GRM 579FGE PM) 02/02/2016 2:35 PM Weight: 226.4 lb Height: 70in Body Surface Area: 2.2 m Body Mass Index: 32.48 kg/m  Temp.: 98.30F(Oral)  BP: 152/84 (Sitting, Left Arm, Standard)      Physical Exam Rodman Key K. Atiba Kimberlin MD; 02/02/2016 5:27 PM)  The physical exam findings are as follows: Note:WDWN in NAD HEENT: EOMI, sclera anicteric Neck: No masses, no thyromegaly Lungs: CTA bilaterally; normal respiratory effort Chest - overlying sternum - 4 x 3 cm keloid lesion with central eschar with some minimal drainage; no surrounding cellulitis CV: Regular rate and rhythm; no murmurs Abd: +bowel sounds, soft, non-tender, no masses Ext: Well-perfused; no edema Skin: Warm, dry; no sign of jaundice    Assessment & Plan Rodman Key K. Breion Novacek MD; 02/02/2016 3:17 PM)  KELOID (L91.0) Impression: 5 cm keloid with drainage - center chest  Current Plans Schedule for Surgery - Excision of keloid - chest. The surgical procedure has been discussed with the patient. Potential risks, benefits, alternative treatments, and expected outcomes have been explained. All of the patient's questions at this time have been answered. The likelihood of reaching the patient's treatment goal is good. The patient understand the proposed surgical procedure and wishes to proceed.  Brian Maxwell. Georgette Dover, MD, [facility]  General/ Trauma Surgery  02/02/2016 5:28 PM

## 2016-02-23 NOTE — Anesthesia Preprocedure Evaluation (Addendum)
Anesthesia Evaluation  Patient identified by MRN, date of birth, ID band Patient awake    Reviewed: Allergy & Precautions, NPO status , Patient's Chart, lab work & pertinent test results  History of Anesthesia Complications Negative for: history of anesthetic complications  Airway Mallampati: II  TM Distance: >3 FB Neck ROM: Full    Dental  (+) Teeth Intact, Dental Advisory Given, Chipped,    Pulmonary neg pulmonary ROS,    Pulmonary exam normal breath sounds clear to auscultation       Cardiovascular hypertension, Pt. on medications (-) angina(-) Past MI Normal cardiovascular exam Rhythm:Regular Rate:Normal     Neuro/Psych PSYCHIATRIC DISORDERS Anxiety Depression negative neurological ROS     GI/Hepatic negative GI ROS, Neg liver ROS,   Endo/Other  Obesity   Renal/GU negative Renal ROS     Musculoskeletal negative musculoskeletal ROS (+)   Abdominal   Peds  Hematology negative hematology ROS (+)   Anesthesia Other Findings Day of surgery medications reviewed with the patient.  Reproductive/Obstetrics                            Anesthesia Physical Anesthesia Plan  ASA: II  Anesthesia Plan: General   Post-op Pain Management:    Induction: Intravenous  Airway Management Planned: Oral ETT  Additional Equipment:   Intra-op Plan:   Post-operative Plan: Extubation in OR  Informed Consent: I have reviewed the patients History and Physical, chart, labs and discussed the procedure including the risks, benefits and alternatives for the proposed anesthesia with the patient or authorized representative who has indicated his/her understanding and acceptance.   Dental advisory given  Plan Discussed with: CRNA  Anesthesia Plan Comments: (Risks/benefits of general anesthesia discussed with patient including risk of damage to teeth, lips, gum, and tongue, nausea/vomiting, allergic  reactions to medications, and the possibility of heart attack, stroke and death.  All patient questions answered.  Patient wishes to proceed.)        Anesthesia Quick Evaluation

## 2016-02-23 NOTE — Progress Notes (Signed)
Report given to ly rn as caregiver 

## 2016-02-23 NOTE — Discharge Instructions (Signed)
Keep dressing dry for 48 hours Remove dressing on Friday, then you may shower over the steri-strips. Avoid any strenuous activity with your arms. The steri-strips will come off in a couple of weeks.  Use pain medication as needed. Use stool softeners as needed to avoid constipation. You may drive when off the narcotic pain medication.  Try to transition to Motrin or Tylenol as soon as possible.

## 2016-02-24 ENCOUNTER — Encounter (HOSPITAL_COMMUNITY): Payer: Self-pay | Admitting: Surgery

## 2016-02-24 NOTE — Anesthesia Postprocedure Evaluation (Signed)
Anesthesia Post Note  Patient: Brian Maxwell  Procedure(s) Performed: Procedure(s) (LRB): EXCISION CHEST WALL KELOID (N/A)  Patient location during evaluation: PACU Anesthesia Type: General Level of consciousness: awake and alert Pain management: pain level controlled Vital Signs Assessment: post-procedure vital signs reviewed and stable Respiratory status: spontaneous breathing, nonlabored ventilation, respiratory function stable and patient connected to nasal cannula oxygen Cardiovascular status: blood pressure returned to baseline and stable Postop Assessment: no signs of nausea or vomiting Anesthetic complications: no    Last Vitals:  Filed Vitals:   02/23/16 1310 02/23/16 1328  BP: 135/88 138/88  Pulse: 68 63  Temp: 36.6 C   Resp: 14 18    Last Pain:  Filed Vitals:   02/23/16 1329  PainSc: 2                  Catalina Gravel

## 2016-04-10 ENCOUNTER — Emergency Department (HOSPITAL_COMMUNITY): Payer: Medicare Other

## 2016-04-10 ENCOUNTER — Encounter (HOSPITAL_COMMUNITY): Payer: Self-pay | Admitting: Emergency Medicine

## 2016-04-10 ENCOUNTER — Emergency Department (HOSPITAL_COMMUNITY)
Admission: EM | Admit: 2016-04-10 | Discharge: 2016-04-10 | Disposition: A | Discharge status: Home / Self Care | Payer: Medicare Other | Attending: Emergency Medicine | Admitting: Emergency Medicine

## 2016-04-10 DIAGNOSIS — B349 Viral infection, unspecified: Secondary | ICD-10-CM | POA: Insufficient documentation

## 2016-04-10 DIAGNOSIS — I10 Essential (primary) hypertension: Secondary | ICD-10-CM | POA: Insufficient documentation

## 2016-04-10 DIAGNOSIS — Z79899 Other long term (current) drug therapy: Secondary | ICD-10-CM | POA: Insufficient documentation

## 2016-04-10 DIAGNOSIS — C496 Malignant neoplasm of connective and soft tissue of trunk, unspecified: Secondary | ICD-10-CM | POA: Diagnosis not present

## 2016-04-10 DIAGNOSIS — R509 Fever, unspecified: Secondary | ICD-10-CM | POA: Diagnosis present

## 2016-04-10 LAB — COMPREHENSIVE METABOLIC PANEL
ALBUMIN: 4.3 g/dL (ref 3.5–5.0)
ALK PHOS: 88 U/L (ref 38–126)
ALT: 29 U/L (ref 17–63)
ANION GAP: 8 (ref 5–15)
AST: 37 U/L (ref 15–41)
BILIRUBIN TOTAL: 0.7 mg/dL (ref 0.3–1.2)
BUN: 15 mg/dL (ref 6–20)
CALCIUM: 9.1 mg/dL (ref 8.9–10.3)
CO2: 21 mmol/L — ABNORMAL LOW (ref 22–32)
Chloride: 105 mmol/L (ref 101–111)
Creatinine, Ser: 1.26 mg/dL — ABNORMAL HIGH (ref 0.61–1.24)
GFR calc Af Amer: >60 mL/min (ref >60)
GFR, EST NON AFRICAN AMERICAN: 57 mL/min — AB (ref >60)
GLUCOSE: 127 mg/dL — AB (ref 65–99)
Potassium: 4.4 mmol/L (ref 3.5–5.1)
Sodium: 134 mmol/L — ABNORMAL LOW (ref 135–145)
TOTAL PROTEIN: 7.6 g/dL (ref 6.5–8.1)

## 2016-04-10 LAB — URINALYSIS, ROUTINE W REFLEX MICROSCOPIC
Bilirubin Urine: NEGATIVE
GLUCOSE, UA: NEGATIVE mg/dL
HGB URINE DIPSTICK: NEGATIVE
KETONES UR: NEGATIVE mg/dL
Nitrite: NEGATIVE
PROTEIN: NEGATIVE mg/dL
Specific Gravity, Urine: 1.029 (ref 1.005–1.030)
pH: 5.5 (ref 5.0–8.0)

## 2016-04-10 LAB — CBC
HCT: 39.9 % (ref 39.0–52.0)
Hemoglobin: 14.7 g/dL (ref 13.0–17.0)
MCH: 33.7 pg (ref 26.0–34.0)
MCHC: 36.8 g/dL — AB (ref 30.0–36.0)
MCV: 91.5 fL (ref 78.0–100.0)
PLATELETS: 168 10*3/uL (ref 150–400)
RBC: 4.36 MIL/uL (ref 4.22–5.81)
RDW: 12.9 % (ref 11.5–15.5)
WBC: 5.7 10*3/uL (ref 4.0–10.5)

## 2016-04-10 LAB — URINE MICROSCOPIC-ADD ON: RBC / HPF: NONE SEEN RBC/hpf (ref 0–5)

## 2016-04-10 LAB — I-STAT CG4 LACTIC ACID, ED: LACTIC ACID, VENOUS: 0.94 mmol/L (ref 0.5–1.9)

## 2016-04-10 MED ORDER — SODIUM CHLORIDE 0.9 % IV BOLUS (SEPSIS)
1000.0000 mL | Freq: Once | INTRAVENOUS | Status: AC
  Administered 2016-04-10: 1000 mL via INTRAVENOUS

## 2016-04-10 NOTE — ED Notes (Addendum)
Pt states he last took tylenol at one hour ago. Pt states he had a keloid tumor removed from the center of his chest 3 weeks ago. Area appears to be healing well and is intact

## 2016-04-10 NOTE — Discharge Instructions (Signed)
Please read and follow all provided instructions.  Your diagnoses today include:  1. Viral syndrome     Tests performed today include: Vital signs. See below for your results today.   Medications prescribed:  Take as prescribed   Home care instructions:  Follow any educational materials contained in this packet.  Follow-up instructions: Please follow-up with your primary care provider for further evaluation of symptoms and treatment   Return instructions:  Please return to the Emergency Department if you do not get better, if you get worse, or new symptoms OR  - Fever (temperature greater than 101.33F)  - Bleeding that does not stop with holding pressure to the area    -Severe pain (please note that you may be more sore the day after your accident)  - Chest Pain  - Difficulty breathing  - Severe nausea or vomiting  - Inability to tolerate food and liquids  - Passing out  - Skin becoming red around your wounds  - Change in mental status (confusion or lethargy)  - New numbness or weakness    Please return if you have any other emergent concerns.  Additional Information:  Your vital signs today were: BP 118/71 (BP Location: Left Arm)    Pulse 87    Temp 99.7 F (37.6 C) (Oral)    Resp 18    Ht 5\' 10"  (1.778 m)    Wt 99.8 kg    SpO2 94%    BMI 31.57 kg/m  If your blood pressure (BP) was elevated above 135/85 this visit, please have this repeated by your doctor within one month. ---------------

## 2016-04-10 NOTE — ED Provider Notes (Signed)
[facility] DEPT Provider Note   CSN: [id number] Arrival date & time: 04/10/16  1854   By signing my name below, I, Brian Maxwell, attest that this documentation has been prepared under the direction and in the presence of  [name], PA-C. Electronically Signed: Royce Maxwell, ED Scribe. 04/10/16. 8:29 PM.  History   Chief Complaint Chief Complaint  Patient presents with  . Fever   The history is provided by the patient. No language interpreter was used.    HPI Comments:  Brian Maxwell is a 70 y.o. male who presents to the Emergency Department complaining of a fever of 102.9 beginning today.  He notes associated chills.  No alleviating factors noted.  He denies nausea, vomiting, diarrhea, dysuria, back pain, cough, sinus congestion, ear aches and sore throat.   Pt is also concerned about a post-surgical infection.  Pt had a keloid tumor removed by Dr. 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  After the surgery pt began seeing purulence at the site of the incision.  He was given Bactrim and Keflex by his PCP a week ago.  He is almost finished with the Bactrim and intends to take the Keflex afterwards, but is concerned about the diarrhea as a side effect.  He is going on vacation and wants to know if it would be okay to delay taking Keflex.      Past Medical History:  Diagnosis Date  . Anemia    as child  . Anxiety attack    pt. describes several encounters with anxiety, needing immed. attention with visits to the ED.  Pt. reports that he has learned relaxation tecniques that are helpful & he is much more resolved with the anxiety now.   . Blood transfusion without reported diagnosis    age 78  . Bronchitis   . Cancer ([place])    melanoma calf and back, all in situ   . Cataract    bilateral   . Depression   . Glaucoma   . Hypertension     Patient Active Problem List   Diagnosis Date Noted  . Depression with anxiety 05/21/2013    Past Surgical History:  Procedure Laterality  Date  . COLONOSCOPY    . EXCISION OF KELOID N/A 02/23/2016   Procedure: EXCISION CHEST WALL KELOID;  Surgeon: [name], MD;  Location: [place];  Service: General;  Laterality: N/A;  . MELANOMA EXCISION     x 3 back and calf   . POLYPECTOMY         Home Medications    Prior to Admission medications   Medication Sig Start Date End Date Taking? Authorizing Provider  allopurinol (ZYLOPRIM) 100 MG tablet Take 200 mg by mouth every morning.    Yes Historical Provider, MD  buPROPion (WELLBUTRIN XL) 300 MG 24 hr tablet Take 300 mg by mouth daily with breakfast.    Yes Historical Provider, MD  Calcium Carbonate-Vit D-Min (CALCIUM 600+D PLUS MINERALS) 600-400 MG-UNIT CHEW Chew 1 tablet by mouth daily.   Yes Historical Provider, MD  cephALEXin (KEFLEX) 500 MG capsule Take 500 mg by mouth 2 (two) times daily.  04/02/16  Yes Historical Provider, MD  clonazePAM (KLONOPIN) 0.5 MG tablet Take 0.25 mg by mouth at bedtime as needed for anxiety (takes every nighttime.).    Yes Historical Provider, MD  Coenzyme Q10 (EQL COQ10) 300 MG CAPS Take 300 mg by mouth daily.   Yes Historical Provider, MD  Ginkgo Biloba Extract (GNP GINGKO BILOBA EXTRACT) 60 MG CAPS Take  120 mg by mouth daily.   Yes Historical Provider, MD  Glucosamine-Chondroitin (RA GLUCOSAMINE-CHONDROITIN) 750-600 MG TABS Take 1 tablet by mouth daily.   Yes Historical Provider, MD  mirtazapine (REMERON) 7.5 MG tablet Take 7.5 mg by mouth at bedtime.    Yes Historical Provider, MD  OVER THE COUNTER MEDICATION Take 1 Package by mouth daily. [place] Performance and Vitality Vitapak.   Yes Historical Provider, MD  polyethylene glycol (MIRALAX / GLYCOLAX) packet Take 17 g by mouth 2 (two) times daily. 05/18/14  Yes Dorie Rank, MD  psyllium (HYDROCIL/METAMUCIL) 95 % PACK Take 1 packet by mouth at bedtime.   Yes Historical Provider, MD  sulfamethoxazole-trimethoprim (BACTRIM DS,SEPTRA DS) 800-160 MG tablet Take 1 tablet by mouth 2 (two) times daily.  04/02/16   Yes Historical Provider, MD  travoprost, benzalkonium, (TRAVATAN) 0.004 % ophthalmic solution Place 1 drop into both eyes at bedtime.   Yes Historical Provider, MD  valsartan (DIOVAN) 80 MG tablet Take 80 mg by mouth every morning.    Yes Historical Provider, MD  oxyCODONE-acetaminophen (PERCOCET/ROXICET) 5-325 MG tablet Take 1 tablet by mouth every 4 (four) hours as needed for severe pain. Patient not taking: Reported on 04/10/2016 02/23/16   [name], MD    Family History Family History  Problem Relation Age of Onset  . Depression Mother   . Depression Maternal Grandfather   . Colon cancer Neg Hx   . Rectal cancer Neg Hx   . Stomach cancer Neg Hx   . Colon polyps Neg Hx     Social History Social History  Substance Use Topics  . Smoking status: Never Smoker  . Smokeless tobacco: Never Used  . Alcohol use 0.0 oz/week    1 - 2 Glasses of wine per week     Comment: rare-once every few months maybe     Allergies   Diphenhydramine and Doxycycline   Review of Systems Review of Systems  Constitutional: Positive for fever.   10 systems reviewed and all are negative for acute change except as noted in the HPI.    Physical Exam Updated Vital Signs BP 118/71 (BP Location: Left Arm)   Pulse 87   Temp 99.7 F (37.6 C) (Oral)   Resp 18   Ht 5\' 10"  (1.778 m)   Wt 220 lb (99.8 kg)   SpO2 94%   BMI 31.57 kg/m   Physical Exam  Constitutional: He is oriented to person, place, and time. Vital signs are normal. He appears well-developed and well-nourished. No distress.  Well healed vertical surgical incision mid sternum.  No signs of infection, erythema or purulence.  No induration palpable.    HENT:  Head: Normocephalic and atraumatic.  Right Ear: Hearing normal.  Left Ear: Hearing normal.  Eyes: Conjunctivae and EOM are normal. Pupils are equal, round, and reactive to light.  Neck: Normal range of motion. Neck supple.  Cardiovascular: Normal rate, regular rhythm, normal  heart sounds and intact distal pulses.   Pulmonary/Chest: Effort normal and breath sounds normal. No respiratory distress. He has no decreased breath sounds. He has no wheezes. He has no rales.  Abdominal: Soft. Bowel sounds are normal. There is no tenderness.  Musculoskeletal: Normal range of motion.  Neurological: He is alert and oriented to person, place, and time.  Skin: Skin is warm and dry.  Psychiatric: He has a normal mood and affect. His speech is normal and behavior is normal. Thought content normal.  Nursing note and vitals reviewed.  ED Treatments /  Results   DIAGNOSTIC STUDIES:  Oxygen Saturation is 94% on RA, NML by my interpretation.    COORDINATION OF CARE:  8:29 PM Discussed treatment plan with pt at bedside and pt agreed to plan.  Labs (all labs ordered are listed, but only abnormal results are displayed) Labs Reviewed  COMPREHENSIVE METABOLIC PANEL - Abnormal; Notable for the following:       Result Value   Sodium 134 (*)    CO2 21 (*)    Glucose, Bld 127 (*)    Creatinine, Ser 1.26 (*)    GFR calc non Af Amer 57 (*)    All other components within normal limits  URINALYSIS, ROUTINE W REFLEX MICROSCOPIC (NOT AT [facility]) - Abnormal; Notable for the following:    Color, Urine AMBER (*)    APPearance CLOUDY (*)    Leukocytes, UA SMALL (*)    All other components within normal limits  CBC - Abnormal; Notable for the following:    MCHC 36.8 (*)    All other components within normal limits  URINE MICROSCOPIC-ADD ON - Abnormal; Notable for the following:    Squamous Epithelial / LPF 0-5 (*)    Bacteria, UA RARE (*)    Crystals URIC ACID CRYSTALS (*)    All other components within normal limits  CULTURE, BLOOD (ROUTINE X 2)  CULTURE, BLOOD (ROUTINE X 2)  URINE CULTURE  I-STAT CG4 LACTIC ACID, ED  I-STAT CG4 LACTIC ACID, ED   EKG  EKG Interpretation None      Radiology Dg Chest 2 View  Result Date: 04/10/2016 CLINICAL DATA:  Fever for 24 hours EXAM:  CHEST  2 VIEW COMPARISON:  October 10, 2015 FINDINGS: The heart size and mediastinal contours are within normal limits. Both lungs are clear. The visualized skeletal structures are unremarkable. IMPRESSION: No active cardiopulmonary disease. Electronically Signed   By: Dorise Bullion III M.D   On: 04/10/2016 19:40   Procedures Procedures (including critical care time)  Medications Ordered in ED Medications  sodium chloride 0.9 % bolus 1,000 mL (1,000 mLs Intravenous New Bag/Given 04/10/16 2024)   Initial Impression / Assessment and Plan / ED Course  I have reviewed the triage vital signs and the nursing notes.  Pertinent labs & imaging results that were available during my care of the patient were reviewed by me and considered in my medical decision making (see chart for details).  Clinical Course    Final Clinical Impressions(s) / ED Diagnoses  I have reviewed and evaluated the relevant laboratory valuesI have reviewed and evaluated the relevant imaging studies. I have reviewed the relevant previous healthcare records.I obtained HPI from historian. Patient discussed with supervising physician  ED Course:  Assessment: Pt is a 4yM with hx HTN, Anxiety, who presents with fever 103F today. No N/V/D. No Cough. On exam, pt in Nontoxic/nonseptic appearing. VSS. Temp 99.7 Fin ED. Lungs CTA. Heart RRR. Abdomen nontender soft. Well healed surgical incision from keloid removal on 02-23-16. No erythema. No signs of infection. iStat Lactate 0.94. WBC 5.7. Code Sepsis initiated. Made NPO. Given fluids in ED CXR unreamrkable. UA unremarkable. Discussed with supervising physician. Likely viral etiology. Pt well appearing Patient is in no acute distress. Vital Signs are stable. Patient is able to ambulate. Patient able to tolerate PO. Plan is to [place] with follow up to PCP.   Disposition/Plan:  DC Home Additional Verbal discharge instructions given and discussed with patient.  Pt Instructed to f/u with PCP  in the next  week for evaluation and treatment of symptoms. Return precautions given Pt acknowledges and agrees with plan  Supervising Physician Leo Grosser, MD   Final diagnoses:  Viral syndrome    New Prescriptions New Prescriptions   No medications on file   I personally performed the services described in this documentation, which was scribed in my presence. The recorded information has been reviewed and is accurate.    [name], PA-C 04/10/16 2138    Leo Grosser, MD 04/12/16 [phone]

## 2016-04-10 NOTE — ED Triage Notes (Addendum)
Pt reports fever over past 24 hours; pt denies pain or other symptoms. Reported highest temperature of 103. 1500 mg of Tylenol taken 30 minutes ago. Pt reports keloid tumor removed from chest 2 weeks ago.

## 2016-04-12 ENCOUNTER — Emergency Department (HOSPITAL_COMMUNITY): Payer: Medicare Other

## 2016-04-12 ENCOUNTER — Encounter (HOSPITAL_COMMUNITY): Payer: Self-pay | Admitting: *Deleted

## 2016-04-12 ENCOUNTER — Emergency Department (HOSPITAL_COMMUNITY)
Admission: EM | Admit: 2016-04-12 | Discharge: 2016-04-13 | Disposition: A | Discharge status: Home / Self Care | Payer: Medicare Other | Attending: Emergency Medicine | Admitting: Emergency Medicine

## 2016-04-12 DIAGNOSIS — I10 Essential (primary) hypertension: Secondary | ICD-10-CM | POA: Diagnosis not present

## 2016-04-12 DIAGNOSIS — Z79899 Other long term (current) drug therapy: Secondary | ICD-10-CM | POA: Insufficient documentation

## 2016-04-12 DIAGNOSIS — R072 Precordial pain: Secondary | ICD-10-CM | POA: Diagnosis not present

## 2016-04-12 DIAGNOSIS — L03313 Cellulitis of chest wall: Secondary | ICD-10-CM

## 2016-04-12 DIAGNOSIS — R509 Fever, unspecified: Secondary | ICD-10-CM | POA: Diagnosis present

## 2016-04-12 HISTORY — DX: Other amnesia: R41.3

## 2016-04-12 LAB — I-STAT CHEM 8, ED
BUN: 8 mg/dL (ref 6–20)
CHLORIDE: 102 mmol/L (ref 101–111)
Calcium, Ion: 1.16 mmol/L (ref 1.15–1.40)
Creatinine, Ser: 1.1 mg/dL (ref 0.61–1.24)
Glucose, Bld: 115 mg/dL — ABNORMAL HIGH (ref 65–99)
HEMATOCRIT: 41 % (ref 39.0–52.0)
Hemoglobin: 13.9 g/dL (ref 13.0–17.0)
POTASSIUM: 4.3 mmol/L (ref 3.5–5.1)
SODIUM: 137 mmol/L (ref 135–145)
TCO2: 23 mmol/L (ref 0–100)

## 2016-04-12 LAB — CBC WITH DIFFERENTIAL/PLATELET
BASOS PCT: 0 %
Basophils Absolute: 0 10*3/uL (ref 0.0–0.1)
EOS ABS: 0 10*3/uL (ref 0.0–0.7)
Eosinophils Relative: 1 %
HCT: 39.7 % (ref 39.0–52.0)
HEMOGLOBIN: 13.8 g/dL (ref 13.0–17.0)
Lymphocytes Relative: 22 %
Lymphs Abs: 1.2 10*3/uL (ref 0.7–4.0)
MCH: 32.5 pg (ref 26.0–34.0)
MCHC: 34.8 g/dL (ref 30.0–36.0)
MCV: 93.4 fL (ref 78.0–100.0)
Monocytes Absolute: 0.3 10*3/uL (ref 0.1–1.0)
Monocytes Relative: 5 %
NEUTROS ABS: 3.8 10*3/uL (ref 1.7–7.7)
NEUTROS PCT: 72 %
Platelets: 150 10*3/uL (ref 150–400)
RBC: 4.25 MIL/uL (ref 4.22–5.81)
RDW: 13.3 % (ref 11.5–15.5)
WBC: 5.3 10*3/uL (ref 4.0–10.5)

## 2016-04-12 LAB — URINE CULTURE

## 2016-04-12 LAB — I-STAT CG4 LACTIC ACID, ED: Lactic Acid, Venous: 0.93 mmol/L (ref 0.5–1.9)

## 2016-04-12 MED ORDER — CLINDAMYCIN HCL 300 MG PO CAPS: 300.0000 mg | ORAL_CAPSULE | Freq: Three times a day (TID) | ORAL | 0 refills | Status: DC

## 2016-04-12 MED ORDER — SODIUM CHLORIDE 0.9 % IV BOLUS (SEPSIS)
1000.0000 mL | Freq: Once | INTRAVENOUS | Status: AC
  Administered 2016-04-12: 1000 mL via INTRAVENOUS

## 2016-04-12 MED ORDER — ACETAMINOPHEN 500 MG PO TABS
1000.0000 mg | ORAL_TABLET | Freq: Once | ORAL | Status: AC
  Administered 2016-04-12: 1000 mg via ORAL
  Filled 2016-04-12: qty 2

## 2016-04-12 MED ORDER — IOPAMIDOL (ISOVUE-300) INJECTION 61%
75.0000 mL | Freq: Once | INTRAVENOUS | Status: AC | PRN
  Administered 2016-04-12: 75 mL via INTRAVENOUS

## 2016-04-12 MED ORDER — CLINDAMYCIN HCL 300 MG PO CAPS
300.0000 mg | ORAL_CAPSULE | Freq: Once | ORAL | Status: AC
  Administered 2016-04-13: 300 mg via ORAL
  Filled 2016-04-12: qty 1

## 2016-04-12 NOTE — ED Notes (Signed)
Patient transported to CT 

## 2016-04-12 NOTE — ED Provider Notes (Signed)
[facility] DEPT Provider Note   CSN: [id number] Arrival date & time: 04/12/16  1810     History   Chief Complaint Chief Complaint  Patient presents with  . Fever  . multiple complaints    HPI Brian Maxwell is a 70 y.o. male.With a past medical history of keloid removal in July presenting today with fever and purulent drainage. Patient states this is been going on for the past couple of days. He has had fevers as high as 102 at home. He is concerned for a deep infection and need for possible incision and drainage. He took a course of Bactrim with minimal relief. He has not followed up with his surgeon. He has no further complaints.  10 Systems reviewed and are negative for acute change except as noted in the HPI.    HPI  Past Medical History:  Diagnosis Date  . Anemia    as child  . Anxiety attack    pt. describes several encounters with anxiety, needing immed. attention with visits to the ED.  Pt. reports that he has learned relaxation tecniques that are helpful & he is much more resolved with the anxiety now.   . Blood transfusion without reported diagnosis    age 70  . Bronchitis   . Cancer ([place])    melanoma calf and back, all in situ   . Cataract    bilateral   . Depression   . Glaucoma   . Hypertension   . Short-term memory loss     Patient Active Problem List   Diagnosis Date Noted  . 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 COLONOSCOPY    . EXCISION OF KELOID N/A 02/23/2016   Procedure: EXCISION CHEST WALL KELOID;  Surgeon: [name], MD;  Location: [facility];  Service: General;  Laterality: N/A;  . MELANOMA EXCISION     x 3 back and calf   . POLYPECTOMY         Home Medications    Prior to Admission medications   Medication Sig Start Date End Date Taking? Authorizing Provider  allopurinol (ZYLOPRIM) 100 MG tablet Take 200 mg by mouth every morning.    Yes Historical Provider, MD    buPROPion (WELLBUTRIN XL) 300 MG 24 hr tablet Take 300 mg by mouth daily with breakfast.    Yes Historical Provider, MD  Calcium Carbonate-Vit D-Min (CALCIUM 600+D PLUS MINERALS) 600-400 MG-UNIT CHEW Chew 1 tablet by mouth daily.   Yes Historical Provider, MD  clonazePAM (KLONOPIN) 0.5 MG tablet Take 0.25 mg by mouth at bedtime as needed for anxiety (takes every nighttime.).    Yes Historical Provider, MD  Coenzyme Q10 (EQL COQ10) 300 MG CAPS Take 300 mg by mouth daily.   Yes Historical Provider, MD  Ginkgo Biloba Extract (GNP GINGKO BILOBA EXTRACT) 60 MG CAPS Take 120 mg by mouth daily.   Yes Historical Provider, MD  Glucosamine-Chondroitin (RA GLUCOSAMINE-CHONDROITIN) 750-600 MG TABS Take 1 tablet by mouth daily.   Yes Historical Provider, MD  mirtazapine (REMERON) 7.5 MG tablet Take 7.5 mg by mouth at bedtime.    Yes Historical Provider, MD  OVER THE COUNTER MEDICATION Take 1 Package by mouth daily. [place] Performance and Vitality Vitapak.   Yes Historical Provider, MD  polyethylene glycol (MIRALAX / GLYCOLAX) packet Take 17 g by mouth 2 (two) times daily. 05/18/14  Yes Dorie Rank, MD  psyllium (HYDROCIL/METAMUCIL) 95 % PACK Take 1 packet by mouth  at bedtime.   Yes Historical Provider, MD  travoprost, benzalkonium, (TRAVATAN) 0.004 % ophthalmic solution Place 1 drop into both eyes at bedtime.   Yes Historical Provider, MD  valsartan (DIOVAN) 80 MG tablet Take 80 mg by mouth every morning.    Yes Historical Provider, MD  clindamycin (CLEOCIN) 300 MG capsule Take 1 capsule (300 mg total) by mouth 3 (three) times daily. 04/12/16   Everlene Balls, MD    Family History Family History  Problem Relation Age of Onset  . Depression Mother   . Depression Maternal Grandfather   . Colon cancer Neg Hx   . Rectal cancer Neg Hx   . Stomach cancer Neg Hx   . Colon polyps Neg Hx     Social History Social History  Substance Use Topics  . Smoking status: Never Smoker  . Smokeless tobacco: Never Used  .  Alcohol use 0.0 oz/week    1 - 2 Glasses of wine per week     Comment: rare-once every few months maybe     Allergies   Bactrim [sulfamethoxazole-trimethoprim]; Diphenhydramine; and Doxycycline   Review of Systems Review of Systems   Physical Exam Updated Vital Signs BP 110/71 (BP Location: Right Arm)   Pulse 89   Temp 98.5 F (36.9 C) (Oral)   Resp 20   SpO2 99%   Physical Exam  Constitutional: He is oriented to person, place, and time. Vital signs are normal. He appears well-developed and well-nourished.  Non-toxic appearance. He does not appear ill. No distress.  HENT:  Head: Normocephalic and atraumatic.  Nose: Nose normal.  Mouth/Throat: Oropharynx is clear and moist. No oropharyngeal exudate.  Eyes: Conjunctivae and EOM are normal. Pupils are equal, round, and reactive to light. No scleral icterus.  Neck: Normal range of motion. Neck supple. No tracheal deviation, no edema, no erythema and normal range of motion present. No thyroid mass and no thyromegaly present.  Cardiovascular: Normal rate, regular rhythm, S1 normal, S2 normal, normal heart sounds, intact distal pulses and normal pulses.  Exam reveals no gallop and no friction rub.   No murmur heard. Pulmonary/Chest: Effort normal and breath sounds normal. No respiratory distress. He has no wheezes. He has no rhonchi. He has no rales.  Substernal incision seen with very small half centimeter opening. No purulence can be expressed. No warmth or tenderness to palpation.  Abdominal: Soft. Normal appearance and bowel sounds are normal. He exhibits no distension, no ascites and no mass. There is no hepatosplenomegaly. There is no tenderness. There is no rebound, no guarding and no CVA tenderness.  Musculoskeletal: Normal range of motion. He exhibits no edema or tenderness.  Lymphadenopathy:    He has no cervical adenopathy.  Neurological: He is alert and oriented to person, place, and time. He has normal strength. No  cranial nerve deficit or sensory deficit.  Skin: Skin is warm, dry and intact. No petechiae and no rash noted. He is not diaphoretic. No erythema. No pallor.  Nursing note and vitals reviewed.    ED Treatments / Results  Labs (all labs ordered are listed, but only abnormal results are displayed) Labs Reviewed  I-STAT CHEM 8, ED - Abnormal; Notable for the following:       Result Value   Glucose, Bld 115 (*)    All other components within normal limits  CBC WITH DIFFERENTIAL/PLATELET  I-STAT CG4 LACTIC ACID, ED    EKG  EKG Interpretation None       Radiology Ct Chest  W Contrast  Result Date: 04/12/2016 CLINICAL DATA:  Bloating, diarrhea, nausea, fatigue, headache, memory loss, and loss of appetite. Substernal chest pain and fever. EXAM: CT CHEST WITH CONTRAST TECHNIQUE: Multidetector CT imaging of the chest was performed during intravenous contrast administration. CONTRAST:  31mL ISOVUE-300 IOPAMIDOL (ISOVUE-300) INJECTION 61% COMPARISON:  04/10/2016 FINDINGS: Cardiovascular: Mild atherosclerotic calcification of the aortic arch and branch vessels. No acute aortic findings. Mediastinum/Nodes: There is a 9 mm right paratracheal node on image 41/2 and a 1.1 cm right infrahilar lymph node on image 86/2. Scattered additional small mediastinal lymph nodes are present. Lungs/Pleura: Dependent subsegmental atelectasis in both lower lobes. Subsegmental atelectasis or scarring anteriorly in the left lower lobe. No pleural effusion. Upper Abdomen: We partially image shape 4 mm hypodense lesion in the right hepatic lobe on image 166/2, probably a cyst or similar benign lesion but technically nonspecific. Musculoskeletal: Postoperative findings from anterior chest wall keloid resection performed in July. Expected sagittally oriented vertical surgical site also with a transverse band like density along the deep margin of the surgical wound and extending tangential to the superficial for margin of the  upper portion of the left rectus abdominus which is slightly more prominent on than on the right. I do not see an abscess or drainable fluid collection. No sternal or substernal abnormality. IMPRESSION: 1. Postoperative findings from the anterior chest wall keloid resection performed in July, with a flat transverse band of density tracking in the subcutaneous tissues tangential to the lower pectoralis and upper rectus abdominus musculature along the deep margin of the surgical wound, but no abscess or drainable fluid collection identified. 2. Mildly enlarged 1.1 cm right infrahilar lymph node, with a upper normal sized right paratracheal lymph node, both technically nonspecific but probably reactive. 3. Dependent subsegmental atelectasis in both lower lobes with some anterior subsegmental atelectasis in the left lower lobe. Electronically Signed   By: Van Clines M.D.   On: 04/12/2016 23:41    Procedures Procedures (including critical care time)  Medications Ordered in ED Medications  clindamycin (CLEOCIN) capsule 300 mg (not administered)  acetaminophen (TYLENOL) tablet 1,000 mg (1,000 mg Oral Given 04/12/16 2238)  sodium chloride 0.9 % bolus 1,000 mL (0 mLs Intravenous Stopped 04/12/16 2308)  iopamidol (ISOVUE-300) 61 % injection 75 mL (75 mLs Intravenous Contrast Given 04/12/16 2311)     Initial Impression / Assessment and Plan / ED Course  I have reviewed the triage vital signs and the nursing notes.  Pertinent labs & imaging results that were available during my care of the patient were reviewed by me and considered in my medical decision making (see chart for details).  Clinical Course    Patient presents to the emergency department out of concern for possible infection of his chest wall. Physical exam is not concerning. He was recently seen, had a chest x-ray obtained and discharged home. Patient however is very anxious, will obtain a CT scan of his chest tube is mind at ease. He is  given Tylenol for his fever. We'll continue to reassess.  11:45 PM CT reveals standing near surgical site.  In the setting of fever in the ED, will treat with clindamycin.  Surgical follow up advised.  He appears well and in NAD. VS remain within his normal limits and he Is safe for DC.  Final Clinical Impressions(s) / ED Diagnoses   Final diagnoses:  Cellulitis of chest wall    New Prescriptions New Prescriptions   CLINDAMYCIN (CLEOCIN) 300 MG CAPSULE  Take 1 capsule (300 mg total) by mouth 3 (three) times daily.     Everlene Balls, MD 04/12/16 [phone]

## 2016-04-12 NOTE — ED Triage Notes (Signed)
Pt was 2 days ago for fever. Pt states since being discharged "my symptoms have increased geometrically." Pt presents a printout from [employer] listing his current symptoms, which include bloating, diarrhea, nausea, fatigue, headache, memory loss, and loss of appetite. Pt states he would like to have a culture taken from the area a keloid was removed on his chest.   Pt last took tylenol at 1PM today.

## 2016-04-13 NOTE — ED Notes (Signed)
Pt ambulatory and independent at discharge.  Verbalized understanding of discharge instructions 

## 2016-04-15 LAB — CULTURE, BLOOD (ROUTINE X 2): CULTURE: NO GROWTH

## 2016-12-25 ENCOUNTER — Emergency Department (HOSPITAL_COMMUNITY)
Admission: EM | Admit: 2016-12-25 | Discharge: 2016-12-25 | Disposition: A | Discharge status: Home / Self Care | Payer: Medicare Other | Attending: Emergency Medicine | Admitting: Emergency Medicine

## 2016-12-25 ENCOUNTER — Encounter (HOSPITAL_COMMUNITY): Payer: Self-pay | Admitting: Emergency Medicine

## 2016-12-25 ENCOUNTER — Emergency Department (HOSPITAL_COMMUNITY): Payer: Medicare Other

## 2016-12-25 DIAGNOSIS — T189XXA Foreign body of alimentary tract, part unspecified, initial encounter: Secondary | ICD-10-CM | POA: Diagnosis present

## 2016-12-25 DIAGNOSIS — Z79899 Other long term (current) drug therapy: Secondary | ICD-10-CM | POA: Insufficient documentation

## 2016-12-25 DIAGNOSIS — I10 Essential (primary) hypertension: Secondary | ICD-10-CM | POA: Insufficient documentation

## 2016-12-25 DIAGNOSIS — Y999 Unspecified external cause status: Secondary | ICD-10-CM | POA: Diagnosis not present

## 2016-12-25 DIAGNOSIS — Z85828 Personal history of other malignant neoplasm of skin: Secondary | ICD-10-CM | POA: Diagnosis not present

## 2016-12-25 DIAGNOSIS — Y939 Activity, unspecified: Secondary | ICD-10-CM | POA: Insufficient documentation

## 2016-12-25 DIAGNOSIS — X58XXXA Exposure to other specified factors, initial encounter: Secondary | ICD-10-CM | POA: Insufficient documentation

## 2016-12-25 DIAGNOSIS — Y929 Unspecified place or not applicable: Secondary | ICD-10-CM | POA: Insufficient documentation

## 2016-12-25 LAB — URINALYSIS, ROUTINE W REFLEX MICROSCOPIC
BILIRUBIN URINE: NEGATIVE
GLUCOSE, UA: NEGATIVE mg/dL
HGB URINE DIPSTICK: NEGATIVE
KETONES UR: NEGATIVE mg/dL
Leukocytes, UA: NEGATIVE
Nitrite: NEGATIVE
PH: 5 (ref 5.0–8.0)
Protein, ur: NEGATIVE mg/dL
Specific Gravity, Urine: 1.009 (ref 1.005–1.030)

## 2016-12-25 NOTE — ED Triage Notes (Signed)
Pt complaint of belching post swallowing wax around 1200 today. Pt uses wax around molars of wisdom teeth and swallowed wax with eating burger at lunch. Pt positive for gas and BM since event. Pt denies pain. Pt just concerned wax needs to be extracted related to belching since event.

## 2016-12-25 NOTE — ED Provider Notes (Signed)
[facility] DEPT Provider Note   CSN: [id number] Arrival date & time: 12/25/16  1828     History   Chief Complaint Chief Complaint  Patient presents with  . Swallowed Foreign Body    HPI Brian Maxwell is a 71 y.o. male.  HPI   Pt p/w concern after swallowed foreign body at noon today.  States he was eating and forgot that he had wax on his back teeth (wax sold by drug stores to cover braces).  Since he swallowed the wax he has had increased belching.  Has been drinking lots of fluids but has not eaten.  Has had two bowel movements since lunch.  He does note that after swallowing the wax he was on his way home and needed to urinate but could not make it to the bathroom.  Denies dysuria, has never had that happen before.  Denies fever, chills, chest pain, nausea, vomiting, abdominal pain.  He is concerned because he is going to the [employer] and wants to get checked out before he leaves.    Past Medical History:  Diagnosis Date  . Anemia    as child  . Anxiety attack    pt. describes several encounters with anxiety, needing immed. attention with visits to the ED.  Pt. reports that he has learned relaxation tecniques that are helpful & he is much more resolved with the anxiety now.   . Blood transfusion without reported diagnosis    age 71  . Bronchitis   . Cancer ([place])    melanoma calf and back, all in situ   . Cataract    bilateral   . Depression   . Glaucoma   . Hypertension   . Short-term memory loss     Patient Active Problem List   Diagnosis Date Noted  . 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 COLONOSCOPY    . EXCISION OF KELOID N/A 02/23/2016   Procedure: EXCISION CHEST WALL KELOID;  Surgeon: [name], MD;  Location: [facility];  Service: General;  Laterality: N/A;  . MELANOMA EXCISION     x 3 back and calf   . POLYPECTOMY         Home Medications    Prior to Admission medications     Medication Sig Start Date End Date Taking? Authorizing Provider  allopurinol (ZYLOPRIM) 100 MG tablet Take 200 mg by mouth every morning.    Yes [provider]  buPROPion (WELLBUTRIN XL) 300 MG 24 hr tablet Take 300 mg by mouth daily with breakfast.    Yes [provider]  Calcium Carbonate-Vit D-Min (CALCIUM 600+D PLUS MINERALS) 600-400 MG-UNIT CHEW Chew 1 tablet by mouth every morning.    Yes [provider]  clonazePAM (KLONOPIN) 0.5 MG tablet Take 0.5 mg by mouth at bedtime as needed for anxiety (takes every nighttime.).    Yes [provider]  Coenzyme Q10 (EQL COQ10) 300 MG CAPS Take 300 mg by mouth every morning.    Yes [provider]  Ginkgo Biloba Extract (GNP GINGKO BILOBA EXTRACT) 60 MG CAPS Take 120 mg by mouth every morning.    Yes [provider]  Glucosamine-Chondroitin (RA GLUCOSAMINE-CHONDROITIN) 750-600 MG TABS Take 1 tablet by mouth every morning.    Yes [provider]  mirtazapine (REMERON) 7.5 MG tablet Take 7.5 mg by mouth at bedtime.    Yes [provider]  OVER THE COUNTER MEDICATION Take 1 Package  by mouth every morning. [place] [employer] and Vitality Vitapak.    Yes [provider]  polyethylene glycol (MIRALAX / GLYCOLAX) packet Take 17 g by mouth 2 (two) times daily. 05/18/14  Yes Dorie Rank, MD  psyllium (HYDROCIL/METAMUCIL) 95 % PACK Take 1 packet by mouth at bedtime.   Yes [provider]  travoprost, benzalkonium, (TRAVATAN) 0.004 % ophthalmic solution Place 1 drop into both eyes at bedtime.   Yes [provider]  valsartan (DIOVAN) 80 MG tablet Take 80 mg by mouth every morning.    Yes [provider]  clindamycin (CLEOCIN) 300 MG capsule Take 1 capsule (300 mg total) by mouth 3 (three) times daily. Patient not taking: Reported on 12/25/2016 04/12/16   Everlene Balls, MD    Family History Family History  Problem Relation Age of Onset  . Depression Mother   .  Depression Maternal Grandfather   . Colon cancer Neg Hx   . Rectal cancer Neg Hx   . Stomach cancer Neg Hx   . Colon polyps Neg Hx     Social History Social History  Substance Use Topics  . Smoking status: Never Smoker  . Smokeless tobacco: Never Used  . Alcohol use 0.0 oz/week    1 - 2 Glasses of wine per week     Comment: rare-once every few months maybe     Allergies   Bactrim [sulfamethoxazole-trimethoprim]; Diphenhydramine; and Doxycycline   Review of Systems Review of Systems  All other systems reviewed and are negative.    Physical Exam Updated Vital Signs BP 136/82   Pulse 73   Temp 98 F (36.7 C) (Oral)   Resp 16   SpO2 94%   Physical Exam  Constitutional: He appears well-developed and well-nourished. No distress.  HENT:  Head: Normocephalic and atraumatic.  Neck: Neck supple.  Cardiovascular: Normal rate and regular rhythm.   Pulmonary/Chest: Effort normal and breath sounds normal. No respiratory distress. He has no wheezes. He has no rales.  Abdominal: Soft. He exhibits no distension and no mass. There is no tenderness. There is no rebound and no guarding.  Neurological: He is alert. He exhibits normal muscle tone.  Skin: He is not diaphoretic.  Nursing note and vitals reviewed.    ED Treatments / Results  Labs (all labs ordered are listed, but only abnormal results are displayed) Labs Reviewed  URINALYSIS, ROUTINE W REFLEX MICROSCOPIC    EKG  EKG Interpretation None       Radiology Dg Abdomen Acute W/chest  Result Date: 12/25/2016 CLINICAL DATA:  Abdominal pain EXAM: DG ABDOMEN ACUTE W/ 1V CHEST COMPARISON:  04/10/2016 FINDINGS: Cardiac shadow is within normal limits. Aortic calcifications are seen. The lungs are well aerated bilaterally. No focal infiltrate or sizable effusion is seen. Stomach is distended with air and fluid. Scattered large and small bowel gas is noted. This may represent a generalized ileus. No true obstructive  changes are seen. No free air is noted. No acute bony abnormality is noted. Mild degenerative change of the lumbar spine is seen. IMPRESSION: Mild distension of the stomach with scattered large and small bowel gas. Correlation with physical exam is recommended as this may represent an ileus. Electronically Signed   By: Inez Catalina M.D.   On: 12/25/2016 20:57    Procedures Procedures (including critical care time)  Medications Ordered in ED Medications - No data to display   Initial Impression / Assessment and Plan / ED Course  I have reviewed the triage vital  signs and the nursing notes.  Pertinent labs & imaging results that were available during my care of the patient were reviewed by me and considered in my medical decision making (see chart for details).     Afebrile, nontoxic patient with concern for swallowing foreign body, wax, Tolerating PO.  Xray suggests possible ileus though pt is having normal bowel movements and no vomiting, tolerating PO.  Pt also seen by Dr Leonette Monarch who doubts ileus, agrees with d/c home.  UA does not appear infected.   D/C home.  Discussed result, findings, treatment, and follow up  with patient.  Pt given return precautions.  Pt verbalizes understanding and agrees with plan.       Final Clinical Impressions(s) / ED Diagnoses   Final diagnoses:  Swallowed foreign body, initial encounter    New Prescriptions Discharge Medication List as of 12/25/2016  9:47 PM       Cl[place]ton Bibles, Hershal Coria 12/25/16 2256    Fatima Blank, MD 12/26/16 [phone]

## 2016-12-25 NOTE — ED Notes (Signed)
Patient given water

## 2016-12-25 NOTE — ED Notes (Signed)
Patient transported to X-ray 

## 2016-12-25 NOTE — Discharge Instructions (Signed)
Read the information below.  You may return to the Emergency Department at any time for worsening condition or any new symptoms that concern you.   If you develop high fevers, abdominal pain, uncontrolled vomiting, or are unable to tolerate fluids by mouth, return to the ER for a recheck.

## 2017-06-22 ENCOUNTER — Encounter (HOSPITAL_COMMUNITY): Payer: Self-pay | Admitting: Nurse Practitioner

## 2017-06-22 ENCOUNTER — Emergency Department (HOSPITAL_COMMUNITY)
Admission: EM | Admit: 2017-06-22 | Discharge: 2017-06-22 | Disposition: A | Discharge status: Home / Self Care | Payer: Medicare Other | Attending: Emergency Medicine | Admitting: Emergency Medicine

## 2017-06-22 ENCOUNTER — Emergency Department (HOSPITAL_COMMUNITY): Payer: Medicare Other

## 2017-06-22 DIAGNOSIS — I10 Essential (primary) hypertension: Secondary | ICD-10-CM | POA: Insufficient documentation

## 2017-06-22 DIAGNOSIS — Z79899 Other long term (current) drug therapy: Secondary | ICD-10-CM | POA: Insufficient documentation

## 2017-06-22 DIAGNOSIS — K529 Noninfective gastroenteritis and colitis, unspecified: Secondary | ICD-10-CM | POA: Insufficient documentation

## 2017-06-22 DIAGNOSIS — K5900 Constipation, unspecified: Secondary | ICD-10-CM | POA: Diagnosis present

## 2017-06-22 LAB — CBC WITH DIFFERENTIAL/PLATELET
BASOS PCT: 0 %
Basophils Absolute: 0 10*3/uL (ref 0.0–0.1)
EOS ABS: 0.2 10*3/uL (ref 0.0–0.7)
Eosinophils Relative: 2 %
HCT: 41.8 % (ref 39.0–52.0)
Hemoglobin: 14.9 g/dL (ref 13.0–17.0)
Lymphocytes Relative: 37 %
Lymphs Abs: 2.9 10*3/uL (ref 0.7–4.0)
MCH: 33.7 pg (ref 26.0–34.0)
MCHC: 35.6 g/dL (ref 30.0–36.0)
MCV: 94.6 fL (ref 78.0–100.0)
MONO ABS: 0.6 10*3/uL (ref 0.1–1.0)
MONOS PCT: 8 %
NEUTROS PCT: 53 %
Neutro Abs: 4.1 10*3/uL (ref 1.7–7.7)
PLATELETS: 277 10*3/uL (ref 150–400)
RBC: 4.42 MIL/uL (ref 4.22–5.81)
RDW: 13.1 % (ref 11.5–15.5)
WBC: 7.8 10*3/uL (ref 4.0–10.5)

## 2017-06-22 LAB — COMPREHENSIVE METABOLIC PANEL
ALT: 28 U/L (ref 17–63)
AST: 26 U/L (ref 15–41)
Albumin: 4.2 g/dL (ref 3.5–5.0)
Alkaline Phosphatase: 82 U/L (ref 38–126)
Anion gap: 6 (ref 5–15)
BUN: 17 mg/dL (ref 6–20)
CHLORIDE: 106 mmol/L (ref 101–111)
CO2: 25 mmol/L (ref 22–32)
CREATININE: 1.16 mg/dL (ref 0.61–1.24)
Calcium: 9.5 mg/dL (ref 8.9–10.3)
GFR calc non Af Amer: >60 mL/min (ref >60)
Glucose, Bld: 116 mg/dL — ABNORMAL HIGH (ref 65–99)
POTASSIUM: 4.5 mmol/L (ref 3.5–5.1)
SODIUM: 137 mmol/L (ref 135–145)
Total Bilirubin: 0.7 mg/dL (ref 0.3–1.2)
Total Protein: 7.6 g/dL (ref 6.5–8.1)

## 2017-06-22 LAB — URINALYSIS, ROUTINE W REFLEX MICROSCOPIC
BILIRUBIN URINE: NEGATIVE
Glucose, UA: NEGATIVE mg/dL
Hgb urine dipstick: NEGATIVE
Ketones, ur: NEGATIVE mg/dL
Leukocytes, UA: NEGATIVE
NITRITE: NEGATIVE
PH: 6 (ref 5.0–8.0)
Protein, ur: NEGATIVE mg/dL
SPECIFIC GRAVITY, URINE: 1.003 — AB (ref 1.005–1.030)

## 2017-06-22 MED ORDER — DICYCLOMINE HCL 20 MG PO TABS: 20.0000 mg | ORAL_TABLET | Freq: Two times a day (BID) | ORAL | 0 refills | Status: DC

## 2017-06-22 NOTE — ED Triage Notes (Signed)
Pt states he has not "had a decent bowel movement for the last 12 days." he adds that this is causing him intermittent abdominal pain. Also states that although he uses Milarax typically he able to have a BM q2days. He is concerned he may have a bowel obstructions. Remarks on noticing mild abdominal distension and flatulence.

## 2017-06-22 NOTE — Discharge Instructions (Signed)
Liquid diet.  Bentyl as needed for cramps.  Follow up with your primary care physician.

## 2017-06-22 NOTE — ED Provider Notes (Signed)
[facility] DEPT Provider Note   CSN: [id number] Arrival date & time: 06/22/17  1501     History   Chief Complaint Chief Complaint  Patient presents with  . Abdominal Pain  . Constipation    HPI Brian Maxwell is a 71 y.o. male. Chief complaint is constipation  HPI 71 year old male. He states that he has wife "got second [place]". They spent 4 days in [place]. They returned 10 days ago. He states that he has not had "much" of a bowel movement since that time. No diarrhea. Small amount of formed stool each day. Feels distended. No vomiting. Less of an appetite but no nausea or vomiting. No fevers or chills. No difficulty breathing.  He states that he feels like he has wife "shared" some sort of an illness he may have picked up in [place]. She went to fast med/urgent care today. He came here. He does not know if she has had an evaluation. Her symptoms of "lesser" versus his symptoms.  He takes MiraLAX twice a day for chronic constipation. No surgical history. No history of hernias.  Past Medical History:  Diagnosis Date  . Anemia    as child  . Anxiety attack    pt. describes several encounters with anxiety, needing immed. attention with visits to the ED.  Pt. reports that he has learned relaxation tecniques that are helpful & he is much more resolved with the anxiety now.   . Blood transfusion without reported diagnosis    age 34  . Bronchitis   . Cancer ([place])    melanoma calf and back, all in situ   . Cataract    bilateral   . Depression   . Glaucoma   . Hypertension   . Short-term memory loss     Patient Active Problem List   Diagnosis Date Noted  . 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 COLONOSCOPY    . EXCISION CHEST WALL KELOID N/A 02/23/2016   Performed by [name], MD at [facility] OR  . MELANOMA EXCISION     x 3 back and calf   . POLYPECTOMY          Home Medications    Prior to Admission medications   Medication Sig Start Date End Date Taking? Authorizing Provider  allopurinol (ZYLOPRIM) 100 MG tablet Take 200 mg by mouth every morning.    Yes [provider]  buPROPion (WELLBUTRIN XL) 300 MG 24 hr tablet Take 300 mg by mouth daily with breakfast.    Yes [provider]  Calcium Carbonate-Vit D-Min (CALCIUM 600+D PLUS MINERALS) 600-400 MG-UNIT CHEW Chew 2 tablets every morning by mouth.    Yes [provider]  clonazePAM (KLONOPIN) 0.5 MG tablet Take 0.5 mg 2 (two) times daily as needed by mouth for anxiety (takes every nighttime.).    Yes [provider]  Coenzyme Q10 (EQL COQ10) 300 MG CAPS Take 300 mg by mouth every morning.    Yes [provider]  Ginkgo Biloba Extract (GNP GINGKO BILOBA EXTRACT) 60 MG CAPS Take 120 mg by mouth every morning.    Yes [provider]  Glucosamine-Chondroitin (RA GLUCOSAMINE-CHONDROITIN) 750-600 MG TABS Take 1 tablet by mouth every morning.    Yes [provider]  ketoconazole (NIZORAL) 2 % shampoo APPLY ON THE SKIN AS DIRECTED , LATHER AND LET SIT FOR A FEW MINS PRIOR TO RINSING WHEN YOU SHOWER  05/22/17  Yes [provider]  losartan (COZAAR) 50 MG tablet TAKE 1 TABLET BY MOUTH EVERY MORNING . DISCONTINUE VALSARTAN 04/06/17  Yes [provider]  mirtazapine (REMERON) 15 MG tablet Take 7.5 mg at bedtime by mouth. 05/09/17  Yes [provider]  OVER THE COUNTER MEDICATION Take 1 Package by mouth every morning. [place] [employer] and Vitality Vitapak.    Yes [provider]  polyethylene glycol (MIRALAX / GLYCOLAX) packet Take 17 g by mouth 2 (two) times daily. 05/18/14  Yes Dorie Rank, MD  psyllium (HYDROCIL/METAMUCIL) 95 % PACK Take 1 packet by mouth at bedtime.   Yes [provider]  TRAVATAN Z 0.004 % SOLN ophthalmic solution INSTILL 1 DROP INTO BOTH EYES EVERY DAY AT NIGHT 05/25/17  Yes  [provider]  clindamycin (CLEOCIN) 300 MG capsule Take 1 capsule (300 mg total) by mouth 3 (three) times daily. Patient not taking: Reported on 12/25/2016 04/12/16   Everlene Balls, MD  dicyclomine (BENTYL) 20 MG tablet Take 1 tablet (20 mg total) 2 (two) times daily by mouth. 06/22/17   Tanna Furry, MD  mirtazapine (REMERON) 7.5 MG tablet Take 7.5 mg by mouth at bedtime.     [provider]  travoprost, benzalkonium, (TRAVATAN) 0.004 % ophthalmic solution Place 1 drop into both eyes at bedtime.    [provider]  valsartan (DIOVAN) 80 MG tablet Take 80 mg by mouth every morning.     [provider]    Family History Family History  Problem Relation Age of Onset  . Depression Mother   . Depression Maternal Grandfather   . Colon cancer Neg Hx   . Rectal cancer Neg Hx   . Stomach cancer Neg Hx   . Colon polyps Neg Hx     Social History Social History   Tobacco Use  . Smoking status: Never Smoker  . Smokeless tobacco: Never Used  Substance Use Topics  . Alcohol use: Yes    Alcohol/week: 0.0 oz    Types: 1 - 2 Glasses of wine per week    Comment: rare-once every few months maybe  . Drug use: No     Allergies   Bactrim [sulfamethoxazole-trimethoprim]; Diphenhydramine; and Doxycycline   Review of Systems Review of Systems  Constitutional: Negative for appetite change, chills, diaphoresis, fatigue and fever.  HENT: Negative for mouth sores, sore throat and trouble swallowing.   Eyes: Negative for visual disturbance.  Respiratory: Negative for cough, chest tightness, shortness of breath and wheezing.   Cardiovascular: Negative for chest pain.  Gastrointestinal: Positive for abdominal distention, abdominal pain and constipation. Negative for diarrhea, nausea and vomiting.  Endocrine: Negative for polydipsia, polyphagia and polyuria.  Genitourinary: Negative for dysuria, frequency and hematuria.  Musculoskeletal: Negative for gait problem.   Skin: Negative for color change, pallor and rash.  Neurological: Negative for dizziness, syncope, light-headedness and headaches.  Hematological: Does not bruise/bleed easily.  Psychiatric/Behavioral: Negative for behavioral problems and confusion.     Physical Exam Updated Vital Signs BP 140/84 (BP Location: Right Arm)   Pulse 80   Temp 98.1 F (36.7 C) (Oral)   Resp 14   SpO2 96%   Physical Exam  Constitutional: He is oriented to person, place, and time. He appears well-developed and well-nourished. No distress.  HENT:  Head: Normocephalic.  Eyes: Conjunctivae are normal. Pupils are equal, round, and reactive to light. No scleral icterus.  Neck: Normal range of motion. Neck supple. No thyromegaly present.  Cardiovascular: Normal rate  and regular rhythm. Exam reveals no gallop and no friction rub.  No murmur heard. Pulmonary/Chest: Effort normal and breath sounds normal. No respiratory distress. He has no wheezes. He has no rales.  Abdominal: Soft. Bowel sounds are normal. He exhibits no distension. There is generalized tenderness. There is no rebound.  Bowel sounds present to slightly hyperactive. No hernia. No incisions. No stool in the vault.  Genitourinary: Rectum normal. Rectal exam shows no mass and no tenderness.  Musculoskeletal: Normal range of motion.  Neurological: He is alert and oriented to person, place, and time.  Skin: Skin is warm and dry. No rash noted.  Psychiatric: He has a normal mood and affect. His behavior is normal.     ED Treatments / Results  Labs (all labs ordered are listed, but only abnormal results are displayed) Labs Reviewed  COMPREHENSIVE METABOLIC PANEL - Abnormal; Notable for the following components:      Result Value   Glucose, Bld 116 (*)    All other components within normal limits  URINALYSIS, ROUTINE W REFLEX MICROSCOPIC - Abnormal; Notable for the following components:   Color, Urine STRAW (*)    Specific Gravity, Urine 1.003  (*)    All other components within normal limits  CBC WITH DIFFERENTIAL/PLATELET    EKG  EKG Interpretation None       Radiology Dg Abd Acute W/chest  Result Date: 06/22/2017 CLINICAL DATA:  Constipation and abdominal pain EXAM: DG ABDOMEN ACUTE W/ 1V CHEST COMPARISON:  Radiograph 12/25/2016 FINDINGS: There are no dilated small bowel loops. No free air. The stomach is gas distended. No radiopaque calculi or other significant radiographic abnormality is seen. Heart size and mediastinal contours are within normal limits. Both lungs are clear. IMPRESSION: No acute abdominal or cardiopulmonary process. Electronically Signed   By: Ulyses Jarred M.D.   On: 06/22/2017 17:10    Procedures Procedures (including critical care time)  Medications Ordered in ED Medications - No data to display   Initial Impression / Assessment and Plan / ED Course  I have reviewed the triage vital signs and the nursing notes.  Pertinent labs & imaging results that were available during my care of the patient were reviewed by me and considered in my medical decision making (see chart for details).   constipation. Normal vital signs. Afebrile. No other red flag symptoms. Plan basic labs and acute abdominal series and reevaluation.   Final Clinical Impressions(s) / ED Diagnoses   Final diagnoses:  Enteritis   Reassuring studies. No leukocytosis. Normal labs. Nonspecific bowel gas pattern without constipation or obstruction. I think his symptoms are consistent with acute viral process and some enteritis even though he is not had diarrhea. Plan liquid diet. Do not encourage additional laxative at this time. Recheck with bloody stools, localizing pain, other changes.    ED Discharge Orders        Ordered    dicyclomine (BENTYL) 20 MG tablet  2 times daily     06/22/17 1750       Tanna Furry, MD 06/22/17 1751

## 2017-07-04 ENCOUNTER — Emergency Department (HOSPITAL_COMMUNITY)
Admission: EM | Admit: 2017-07-04 | Discharge: 2017-07-04 | Disposition: A | Discharge status: Home / Self Care | Payer: Medicare Other | Attending: Emergency Medicine | Admitting: Emergency Medicine

## 2017-07-04 ENCOUNTER — Encounter (HOSPITAL_COMMUNITY): Payer: Self-pay | Admitting: Family Medicine

## 2017-07-04 ENCOUNTER — Emergency Department (HOSPITAL_COMMUNITY): Payer: Medicare Other

## 2017-07-04 DIAGNOSIS — Z85820 Personal history of malignant melanoma of skin: Secondary | ICD-10-CM | POA: Insufficient documentation

## 2017-07-04 DIAGNOSIS — R109 Unspecified abdominal pain: Secondary | ICD-10-CM | POA: Diagnosis not present

## 2017-07-04 DIAGNOSIS — E876 Hypokalemia: Secondary | ICD-10-CM

## 2017-07-04 DIAGNOSIS — I10 Essential (primary) hypertension: Secondary | ICD-10-CM | POA: Diagnosis not present

## 2017-07-04 DIAGNOSIS — R197 Diarrhea, unspecified: Secondary | ICD-10-CM | POA: Insufficient documentation

## 2017-07-04 DIAGNOSIS — R1033 Periumbilical pain: Secondary | ICD-10-CM

## 2017-07-04 DIAGNOSIS — Z79899 Other long term (current) drug therapy: Secondary | ICD-10-CM | POA: Insufficient documentation

## 2017-07-04 LAB — URINALYSIS, ROUTINE W REFLEX MICROSCOPIC
Bilirubin Urine: NEGATIVE
Glucose, UA: NEGATIVE mg/dL
HGB URINE DIPSTICK: NEGATIVE
Ketones, ur: NEGATIVE mg/dL
Leukocytes, UA: NEGATIVE
Nitrite: NEGATIVE
PROTEIN: NEGATIVE mg/dL
Specific Gravity, Urine: 1.016 (ref 1.005–1.030)
pH: 5 (ref 5.0–8.0)

## 2017-07-04 LAB — CBC
HEMATOCRIT: 39.7 % (ref 39.0–52.0)
HEMOGLOBIN: 14.2 g/dL (ref 13.0–17.0)
MCH: 33.3 pg (ref 26.0–34.0)
MCHC: 35.8 g/dL (ref 30.0–36.0)
MCV: 93 fL (ref 78.0–100.0)
Platelets: 242 10*3/uL (ref 150–400)
RBC: 4.27 MIL/uL (ref 4.22–5.81)
RDW: 13.4 % (ref 11.5–15.5)
WBC: 7.5 10*3/uL (ref 4.0–10.5)

## 2017-07-04 LAB — GASTROINTESTINAL PANEL BY PCR, STOOL (REPLACES STOOL CULTURE)
Adenovirus F40/41: NOT DETECTED
Astrovirus: NOT DETECTED
Campylobacter species: NOT DETECTED
Cryptosporidium: NOT DETECTED
Cyclospora cayetanensis: NOT DETECTED
ENTEROAGGREGATIVE E COLI (EAEC): NOT DETECTED
ENTEROPATHOGENIC E COLI (EPEC): NOT DETECTED
Entamoeba histolytica: NOT DETECTED
Enterotoxigenic E coli (ETEC): NOT DETECTED
GIARDIA LAMBLIA: NOT DETECTED
NOROVIRUS GI/GII: DETECTED — AB
Plesimonas shigelloides: NOT DETECTED
ROTAVIRUS A: NOT DETECTED
SALMONELLA SPECIES: NOT DETECTED
SHIGA LIKE TOXIN PRODUCING E COLI (STEC): NOT DETECTED
SHIGELLA/ENTEROINVASIVE E COLI (EIEC): NOT DETECTED
Sapovirus (I, II, IV, and V): NOT DETECTED
Vibrio cholerae: NOT DETECTED
Vibrio species: NOT DETECTED
Yersinia enterocolitica: NOT DETECTED

## 2017-07-04 LAB — COMPREHENSIVE METABOLIC PANEL
ALBUMIN: 3.8 g/dL (ref 3.5–5.0)
ALT: 25 U/L (ref 17–63)
ANION GAP: 8 (ref 5–15)
AST: 28 U/L (ref 15–41)
Alkaline Phosphatase: 70 U/L (ref 38–126)
BUN: 13 mg/dL (ref 6–20)
CO2: 21 mmol/L — AB (ref 22–32)
Calcium: 9 mg/dL (ref 8.9–10.3)
Chloride: 106 mmol/L (ref 101–111)
Creatinine, Ser: 0.98 mg/dL (ref 0.61–1.24)
GFR calc Af Amer: >60 mL/min (ref >60)
GFR calc non Af Amer: >60 mL/min (ref >60)
GLUCOSE: 112 mg/dL — AB (ref 65–99)
POTASSIUM: 3.4 mmol/L — AB (ref 3.5–5.1)
SODIUM: 135 mmol/L (ref 135–145)
Total Bilirubin: 0.9 mg/dL (ref 0.3–1.2)
Total Protein: 7 g/dL (ref 6.5–8.1)

## 2017-07-04 LAB — I-STAT CG4 LACTIC ACID, ED: LACTIC ACID, VENOUS: 1.2 mmol/L (ref 0.5–1.9)

## 2017-07-04 LAB — LIPASE, BLOOD: Lipase: 23 U/L (ref 11–51)

## 2017-07-04 MED ORDER — SODIUM CHLORIDE 0.9 % IV BOLUS (SEPSIS)
500.0000 mL | Freq: Once | INTRAVENOUS | Status: AC
  Administered 2017-07-04: 500 mL via INTRAVENOUS

## 2017-07-04 MED ORDER — IOPAMIDOL (ISOVUE-300) INJECTION 61%
100.0000 mL | Freq: Once | INTRAVENOUS | Status: AC | PRN
  Administered 2017-07-04: 100 mL via INTRAVENOUS

## 2017-07-04 MED ORDER — CIPROFLOXACIN HCL 500 MG PO TABS: 500.0000 mg | ORAL_TABLET | Freq: Two times a day (BID) | ORAL | 0 refills | Status: DC

## 2017-07-04 MED ORDER — SODIUM CHLORIDE 0.9 % IV BOLUS (SEPSIS)
1000.0000 mL | Freq: Once | INTRAVENOUS | Status: AC
  Administered 2017-07-04: 1000 mL via INTRAVENOUS

## 2017-07-04 MED ORDER — POTASSIUM CHLORIDE CRYS ER 20 MEQ PO TBCR
40.0000 meq | EXTENDED_RELEASE_TABLET | Freq: Once | ORAL | Status: AC
  Administered 2017-07-04: 40 meq via ORAL
  Filled 2017-07-04: qty 2

## 2017-07-04 MED ORDER — LOPERAMIDE HCL 2 MG PO CAPS
4.0000 mg | ORAL_CAPSULE | Freq: Once | ORAL | Status: AC
  Administered 2017-07-04: 4 mg via ORAL
  Filled 2017-07-04: qty 2

## 2017-07-04 MED ORDER — IOPAMIDOL (ISOVUE-300) INJECTION 61%
INTRAVENOUS | Status: AC
  Administered 2017-07-04: 100 mL via INTRAVENOUS
  Filled 2017-07-04: qty 100

## 2017-07-04 NOTE — ED Provider Notes (Signed)
[facility] DEPT Provider Note   CSN: [id number] Arrival date & time: 07/04/17  0045     History   Chief Complaint Chief Complaint  Patient presents with  . Diarrhea  . Abdominal Pain    HPI Brian Maxwell is a 71 y.o. male with a hx of HTN, depression, anemia, melanoma presents to the Emergency Department complaining of gradual, persistent, progressively worsening generalized abdominal pain and diarrhea onset 4 days ago after eating oysters off a food buffet.  Patient reports that approximately 12 hours after eating the oysters he developed diarrhea.  He reports the following 24 hours he had approximately 50 episodes of watery diarrhea.  Pt denies melena or hematochezia.  Patient states approximately 20 episodes of watery diarrhea today after taking Imodium.  Pt reports recent travel to [place] but no international travel. No treatments PTA.  Patient reports that eating seems to make his symptoms worse.  He has been drinking Gatorade to remain hydrated.  Pt denies fever, chills, headache, neck pain, chest pain, shortness of breath, weakness, dizziness, syncope, dysuria.  Patient is unsure how many times he has urinated today but does feel dehydrated.  Pt reports he has not eaten any romaine lettuce recently.     The history is provided by the patient and medical records. No language interpreter was used.    Past Medical History:  Diagnosis Date  . Anemia    as child  . Anxiety attack    pt. describes several encounters with anxiety, needing immed. attention with visits to the ED.  Pt. reports that he has learned relaxation tecniques that are helpful & he is much more resolved with the anxiety now.   . Blood transfusion without reported diagnosis    age 40  . Bronchitis   . Cancer ([place])    melanoma calf and back, all in situ   . Cataract    bilateral   . Depression   . Glaucoma   . Hypertension   . Short-term memory loss     Patient  Active Problem List   Diagnosis Date Noted  . 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 COLONOSCOPY    . EXCISION OF KELOID N/A 02/23/2016   Procedure: EXCISION CHEST WALL KELOID;  Surgeon: [name], MD;  Location: [facility];  Service: General;  Laterality: N/A;  . MELANOMA EXCISION     x 3 back and calf   . POLYPECTOMY         Home Medications    Prior to Admission medications   Medication Sig Start Date End Date Taking? Authorizing Provider  allopurinol (ZYLOPRIM) 100 MG tablet Take 200 mg by mouth every morning.    Yes [provider]  buPROPion (WELLBUTRIN XL) 300 MG 24 hr tablet Take 300 mg by mouth daily with breakfast.    Yes [provider]  Calcium Carbonate-Vit D-Min (CALCIUM 600+D PLUS MINERALS) 600-400 MG-UNIT CHEW Chew 2 tablets every morning by mouth.    Yes [provider]  clonazePAM (KLONOPIN) 0.5 MG tablet Take 0.5 mg 2 (two) times daily as needed by mouth for anxiety (takes every nighttime.).    Yes [provider]  Coenzyme Q10 (EQL COQ10) 300 MG CAPS Take 300 mg by mouth every morning.    Yes [provider]  Ginkgo Biloba Extract (GNP GINGKO BILOBA EXTRACT) 60 MG CAPS Take 120 mg by mouth every morning.    Yes [provider]  Glucosamine-Chondroitin (RA GLUCOSAMINE-CHONDROITIN) 750-600 MG TABS Take 1 tablet by mouth every morning.    Yes [provider]  ketoconazole (NIZORAL) 2 % shampoo APPLY ON THE SKIN AS DIRECTED , LATHER AND LET SIT FOR A FEW MINS PRIOR TO RINSING WHEN YOU SHOWER 05/22/17  Yes [provider]  losartan (COZAAR) 50 MG tablet TAKE 1 TABLET BY MOUTH EVERY MORNING . DISCONTINUE VALSARTAN 04/06/17  Yes [provider]  mirtazapine (REMERON) 7.5 MG tablet Take 7.5 mg by mouth at bedtime.    Yes [provider]  OVER THE COUNTER MEDICATION Take 1 Package by mouth every morning. [place] [employer] Vitality  [place].    Yes [provider]  psyllium (HYDROCIL/METAMUCIL) 95 % PACK Take 1 packet by mouth at bedtime.   Yes [provider]  travoprost, benzalkonium, (TRAVATAN) 0.004 % ophthalmic solution Place 1 drop into both eyes at bedtime.   Yes [provider]  ciprofloxacin (CIPRO) 500 MG tablet Take 1 tablet (500 mg total) by mouth every 12 (twelve) hours. 07/04/17   Alailah Safley, Jarrett Soho, PA-C  dicyclomine (BENTYL) 20 MG tablet Take 1 tablet (20 mg total) 2 (two) times daily by mouth. 06/22/17   Tanna Furry, MD    Family History Family History  Problem Relation Age of Onset  . Depression Mother   . Depression Maternal Grandfather   . Colon cancer Neg Hx   . Rectal cancer Neg Hx   . Stomach cancer Neg Hx   . Colon polyps Neg Hx     Social History Social History   Tobacco Use  . Smoking status: Never Smoker  . Smokeless tobacco: Never Used  Substance Use Topics  . Alcohol use: Yes    Alcohol/week: 0.0 oz    Types: 1 - 2 Glasses of wine per week    Comment: rare-once every few months maybe  . Drug use: No     Allergies   Bactrim [sulfamethoxazole-trimethoprim]; Diphenhydramine; and Doxycycline   Review of Systems Review of Systems  Constitutional: Negative for appetite change, diaphoresis, fatigue, fever and unexpected weight change.  HENT: Negative for mouth sores.   Eyes: Negative for visual disturbance.  Respiratory: Negative for cough, chest tightness, shortness of breath and wheezing.   Cardiovascular: Negative for chest pain.  Gastrointestinal: Positive for abdominal pain and diarrhea. Negative for constipation, nausea and vomiting.  Endocrine: Negative for polydipsia, polyphagia and polyuria.  Genitourinary: Negative for dysuria, frequency, hematuria and urgency.  Musculoskeletal: Negative for back pain and neck stiffness.  Skin: Negative for rash.  Allergic/Immunologic: Negative for immunocompromised state.  Neurological: Negative for  syncope, light-headedness and headaches.  Hematological: Does not bruise/bleed easily.  Psychiatric/Behavioral: Negative for sleep disturbance. The patient is not nervous/anxious.      Physical Exam Updated Vital Signs BP 120/79 (BP Location: Left Arm)   Pulse 98   Temp 98.4 F (36.9 C) (Oral)   Resp 16   Ht 5\' 10"  (1.778 m)   Wt 102.1 kg (225 lb)   SpO2 96%   BMI 32.28 kg/m   Physical Exam  Constitutional: He appears well-developed and well-nourished. No distress.  Awake, alert, nontoxic appearance  HENT:  Head: Normocephalic and atraumatic.  Mouth/Throat: Oropharynx is clear and moist. Mucous membranes are dry. No oropharyngeal exudate.  Eyes: Conjunctivae are normal. No scleral icterus.  Neck: Normal range of motion. Neck supple.  Cardiovascular: Normal rate, regular rhythm and intact distal pulses.  Pulmonary/Chest: Effort normal and breath sounds normal. No respiratory  distress. He has no wheezes.  Equal chest expansion  Abdominal: Soft. Bowel sounds are normal. He exhibits no mass. There is tenderness in the epigastric area and periumbilical area. There is no rigidity, no rebound, no guarding and no CVA tenderness.  Musculoskeletal: Normal range of motion. He exhibits no edema.  Neurological: He is alert.  Speech is clear and goal oriented Moves extremities without ataxia  Skin: Skin is warm and dry. He is not diaphoretic.  Psychiatric: He has a normal mood and affect.  Nursing note and vitals reviewed.    ED Treatments / Results  Labs (all labs ordered are listed, but only abnormal results are displayed) Labs Reviewed  COMPREHENSIVE METABOLIC PANEL - Abnormal; Notable for the following components:      Result Value   Potassium 3.4 (*)    CO2 21 (*)    Glucose, Bld 112 (*)    All other components within normal limits  URINALYSIS, ROUTINE W REFLEX MICROSCOPIC - Abnormal; Notable for the following components:   Color, Urine AMBER (*)    APPearance HAZY (*)     Bacteria, UA RARE (*)    Squamous Epithelial / LPF 0-5 (*)    All other components within normal limits  OVA + PARASITE EXAM  GASTROINTESTINAL PANEL BY PCR, STOOL (REPLACES STOOL CULTURE)  LIPASE, BLOOD  CBC  I-STAT CG4 LACTIC ACID, ED    Radiology Ct Abdomen Pelvis W Contrast  Result Date: 07/04/2017 CLINICAL DATA:  Abdominal distention. Mid abdominal pain and nausea. EXAM: CT ABDOMEN AND PELVIS WITH CONTRAST TECHNIQUE: Multidetector CT imaging of the abdomen and pelvis was performed using the standard protocol following bolus administration of intravenous contrast. CONTRAST:  100 cc Isovue-300 IV COMPARISON:  Radiographs 06/22/2017, CT 12/22/2014 FINDINGS: Lower chest: Linear atelectasis in both lower lobes. No pleural fluid. Normal heart size. Hepatobiliary: Prominent liver spanning 24 cm cranial caudal. Tiny hepatic hypodensities are not as well visualized on the current exam, no interval progression. Gallbladder physiologically distended, no calcified stone. No biliary dilatation. Pancreas: No ductal dilatation or inflammation. Spleen: Normal in size without focal abnormality. Adrenals/Urinary Tract: Normal adrenal glands. No hydronephrosis or perinephric edema. Homogeneous renal enhancement with symmetric excretion on delayed phase imaging. Bilateral nonobstructing stones. Left renal cysts including parapelvic cyst. Urinary bladder is physiologically distended without wall thickening. Stomach/Bowel: Stomach physiologically distended. Duodenum decompressed with normal positioning of the duodenum jejunal junction. Central small bowel dilated and fluid-filled measuring up to 4.1 cm. No discrete transition point identified. Distal small bowel is nondilated but also fluid-filled. Questionable mesenteric swirling, for example image 75 sagittal reformats. Fluid-filled/liquid stool in the colon. There is no bowel wall thickening, mesenteric edema or inflammatory change. Appendix is not well visualized on  the current exam. Vascular/Lymphatic: Multiple prominent central mesenteric nodes. No retroperitoneal or pelvic adenopathy. Mild aortic atherosclerosis without aneurysm. Reproductive: Prostate is unremarkable. Other: No ascites, free air free fluid. Musculoskeletal: There are no acute or suspicious osseous abnormalities. Degenerative change throughout spine. IMPRESSION: 1. Fluid-filled large and small bowel without bowel inflammation. Central small bowel is dilated. Overall findings suggest generalized ileus. There is question of mesenteric swirling which can be seen with internal hernia, however no discrete transition point, mesenteric defect, or bowel inflammatory change. 2. Nonobstructing nephrolithiasis. 3.  Aortic Atherosclerosis (ICD10-I70.0). Electronically Signed   By: Jeb Levering M.D.   On: 07/04/2017 04:28    Procedures Procedures (including critical care time)  Medications Ordered in ED Medications  loperamide (IMODIUM) capsule 4 mg (not administered)  sodium chloride 0.9 % bolus 1,000 mL (1,000 mLs Intravenous New Bag/Given 07/04/17 0319)  iopamidol (ISOVUE-300) 61 % injection 100 mL (100 mLs Intravenous Contrast Given 07/04/17 0354)  potassium chloride SA (K-DUR,KLOR-CON) CR tablet 40 mEq (40 mEq Oral Given 07/04/17 0544)  sodium chloride 0.9 % bolus 500 mL (500 mLs Intravenous New Bag/Given 07/04/17 0544)     Initial Impression / Assessment and Plan / ED Course  I have reviewed the triage vital signs and the nursing notes.  Pertinent labs & imaging results that were available during my care of the patient were reviewed by me and considered in my medical decision making (see chart for details).     Presents with abdominal pain and diarrhea after eating oysters.  Consistent with food poisoning.  Epigastric and periumbilical pain without rebound or guarding.  Labs without leukocytosis.  Mild hypokalemia.  Electrolytes otherwise reassuring.  With specific gravity normal and  lactic acid normal.  Ct with air-fluid levels most consistent with ileus.  Repeat exam abdomen is soft without significant tenderness.  Patient reports he is feeling significantly better after fluid rehydration.  Patient given Imodium.  Stool samples sent.  Patient to be discharged home with conservative therapies.  Discussed close follow-up with primary care provider.  Discussed reasons to return to the emergency department including development of fevers, bloody diarrhea, persistent diarrhea, worsening abdominal pain, persistent vomiting or other concerns.  Patient states understanding and is in agreement with the plan.  The patient was discussed with and seen by Dr. Christy Gentles who agrees with the treatment plan.   Final Clinical Impressions(s) / ED Diagnoses   Final diagnoses:  Diarrhea of presumed infectious origin  Hypokalemia  Periumbilical abdominal pain    ED Discharge Orders        Ordered    ciprofloxacin (CIPRO) 500 MG tablet  Every 12 hours     07/04/17 0548       Caysen Whang, Jarrett Soho, PA-C 07/04/17 0550    Ripley Fraise, MD 07/04/17 [phone]

## 2017-07-04 NOTE — Discharge Instructions (Signed)
1. Medications: Cipro, Imodium as needed for persistent diarrhea, usual home medications 2. Treatment: rest, drink plenty of fluids, advance diet slowly 3. Follow Up: Please followup with your primary doctor in 2 days for discussion of your diagnoses and further evaluation after today's visit; if you do not have a primary care doctor use the resource guide provided to find one; Please return to the ER for persistent vomiting, high fevers or worsening symptoms

## 2017-07-04 NOTE — ED Notes (Signed)
Patient walked to bathroom from treatment room 23 to give urine sample. Tolerated well.

## 2017-07-04 NOTE — ED Triage Notes (Signed)
Patient is experiencing mid abd pain with diarrhea and and nausea. Describes pain as sharp, aching, and pressure. Symptoms started Sunday morning. He relates his symptoms to eating oysters on Saturday in [place]. Denies any fever.

## 2017-07-04 NOTE — ED Provider Notes (Signed)
Patient seen/examined in the Emergency Department in conjunction with Midlevel Provider [place] Patient reports diffuse abdominal pain and frequent diarrhea after trip to [place] Exam : Awake alert no distress mild abdominal distention but no focal tenderness Plan: Plan to rehydrate and reassess Stool cultures have been sent   Ripley Fraise, MD 07/04/17 [phone]

## 2019-12-11 ENCOUNTER — Other Ambulatory Visit: Payer: Self-pay | Admitting: Adult Medicine

## 2019-12-22 ENCOUNTER — Other Ambulatory Visit: Payer: Self-pay | Admitting: Adult Medicine

## 2019-12-22 DIAGNOSIS — W57XXXA Bitten or stung by nonvenomous insect and other nonvenomous arthropods, initial encounter: Secondary | ICD-10-CM

## 2019-12-22 DIAGNOSIS — Z85820 Personal history of malignant melanoma of skin: Secondary | ICD-10-CM

## 2019-12-26 ENCOUNTER — Other Ambulatory Visit: Payer: Self-pay | Admitting: Adult Medicine

## 2019-12-26 DIAGNOSIS — W57XXXA Bitten or stung by nonvenomous insect and other nonvenomous arthropods, initial encounter: Secondary | ICD-10-CM

## 2019-12-26 DIAGNOSIS — Z85820 Personal history of malignant melanoma of skin: Secondary | ICD-10-CM

## 2019-12-26 DIAGNOSIS — R222 Localized swelling, mass and lump, trunk: Secondary | ICD-10-CM

## 2020-01-06 ENCOUNTER — Other Ambulatory Visit: Payer: Medicare Other

## 2020-01-06 ENCOUNTER — Ambulatory Visit
Admission: RE | Admit: 2020-01-06 | Discharge: 2020-01-06 | Disposition: A | Discharge status: Home / Self Care | Payer: Medicare PPO | Source: Ambulatory Visit | Attending: Adult Medicine | Admitting: Adult Medicine

## 2020-01-06 DIAGNOSIS — R222 Localized swelling, mass and lump, trunk: Secondary | ICD-10-CM

## 2020-01-06 DIAGNOSIS — W57XXXA Bitten or stung by nonvenomous insect and other nonvenomous arthropods, initial encounter: Secondary | ICD-10-CM

## 2020-01-06 DIAGNOSIS — Z85820 Personal history of malignant melanoma of skin: Secondary | ICD-10-CM

## 2020-01-06 MED ORDER — IOPAMIDOL (ISOVUE-300) INJECTION 61%
75.0000 mL | Freq: Once | INTRAVENOUS | Status: AC | PRN
  Administered 2020-01-06: 75 mL via INTRAVENOUS

## 2021-02-26 ENCOUNTER — Other Ambulatory Visit: Payer: Self-pay

## 2021-02-26 ENCOUNTER — Emergency Department (HOSPITAL_BASED_OUTPATIENT_CLINIC_OR_DEPARTMENT_OTHER): Payer: Medicare PPO

## 2021-02-26 ENCOUNTER — Emergency Department (HOSPITAL_BASED_OUTPATIENT_CLINIC_OR_DEPARTMENT_OTHER)
Admission: EM | Admit: 2021-02-26 | Discharge: 2021-02-26 | Disposition: A | Discharge status: Home / Self Care | Payer: Medicare PPO | Attending: Emergency Medicine | Admitting: Emergency Medicine

## 2021-02-26 ENCOUNTER — Encounter (HOSPITAL_BASED_OUTPATIENT_CLINIC_OR_DEPARTMENT_OTHER): Payer: Self-pay | Admitting: Emergency Medicine

## 2021-02-26 DIAGNOSIS — I10 Essential (primary) hypertension: Secondary | ICD-10-CM | POA: Diagnosis not present

## 2021-02-26 DIAGNOSIS — U071 COVID-19: Secondary | ICD-10-CM | POA: Diagnosis not present

## 2021-02-26 DIAGNOSIS — Z859 Personal history of malignant neoplasm, unspecified: Secondary | ICD-10-CM | POA: Insufficient documentation

## 2021-02-26 DIAGNOSIS — Z79899 Other long term (current) drug therapy: Secondary | ICD-10-CM | POA: Diagnosis not present

## 2021-02-26 DIAGNOSIS — R509 Fever, unspecified: Secondary | ICD-10-CM | POA: Diagnosis present

## 2021-02-26 LAB — CBC WITH DIFFERENTIAL/PLATELET
Abs Immature Granulocytes: 0.04 10*3/uL (ref 0.00–0.07)
Basophils Absolute: 0 10*3/uL (ref 0.0–0.1)
Basophils Relative: 0 %
Eosinophils Absolute: 0.1 10*3/uL (ref 0.0–0.5)
Eosinophils Relative: 1 %
HCT: 40.1 % (ref 39.0–52.0)
Hemoglobin: 14.5 g/dL (ref 13.0–17.0)
Immature Granulocytes: 0 %
Lymphocytes Relative: 11 %
Lymphs Abs: 1 10*3/uL (ref 0.7–4.0)
MCH: 33.6 pg (ref 26.0–34.0)
MCHC: 36.2 g/dL — ABNORMAL HIGH (ref 30.0–36.0)
MCV: 93 fL (ref 80.0–100.0)
Monocytes Absolute: 0.5 10*3/uL (ref 0.1–1.0)
Monocytes Relative: 6 %
Neutro Abs: 7.6 10*3/uL (ref 1.7–7.7)
Neutrophils Relative %: 82 %
Platelets: 237 10*3/uL (ref 150–400)
RBC: 4.31 MIL/uL (ref 4.22–5.81)
RDW: 12.2 % (ref 11.5–15.5)
WBC: 9.2 10*3/uL (ref 4.0–10.5)
nRBC: 0 % (ref 0.0–0.2)

## 2021-02-26 LAB — COMPREHENSIVE METABOLIC PANEL
ALT: 21 U/L (ref 0–44)
AST: 27 U/L (ref 15–41)
Albumin: 4.1 g/dL (ref 3.5–5.0)
Alkaline Phosphatase: 73 U/L (ref 38–126)
Anion gap: 10 (ref 5–15)
BUN: 20 mg/dL (ref 8–23)
CO2: 23 mmol/L (ref 22–32)
Calcium: 8.9 mg/dL (ref 8.9–10.3)
Chloride: 102 mmol/L (ref 98–111)
Creatinine, Ser: 1.05 mg/dL (ref 0.61–1.24)
GFR, Estimated: >60 mL/min (ref >60)
Glucose, Bld: 101 mg/dL — ABNORMAL HIGH (ref 70–99)
Potassium: 4.2 mmol/L (ref 3.5–5.1)
Sodium: 135 mmol/L (ref 135–145)
Total Bilirubin: 0.6 mg/dL (ref 0.3–1.2)
Total Protein: 7.2 g/dL (ref 6.5–8.1)

## 2021-02-26 LAB — URINALYSIS, ROUTINE W REFLEX MICROSCOPIC
Bilirubin Urine: NEGATIVE
Glucose, UA: NEGATIVE mg/dL
Hgb urine dipstick: NEGATIVE
Ketones, ur: NEGATIVE mg/dL
Nitrite: NEGATIVE
Protein, ur: NEGATIVE mg/dL
Specific Gravity, Urine: 1.01 (ref 1.005–1.030)
pH: 5.5 (ref 5.0–8.0)

## 2021-02-26 LAB — URINALYSIS, MICROSCOPIC (REFLEX)

## 2021-02-26 LAB — RESP PANEL BY RT-PCR (FLU A&B, COVID) ARPGX2
Influenza A by PCR: NEGATIVE
Influenza B by PCR: NEGATIVE
SARS Coronavirus 2 by RT PCR: POSITIVE — AB

## 2021-02-26 NOTE — ED Provider Notes (Signed)
[facility] DEPARTMENT Provider Note   CSN: [id number] Arrival date & time: 02/26/21  1816     History Chief Complaint  Patient presents with   Covid Positive   Fever    Brian Maxwell is a 75 y.o. male.  Patient to ED with fever, Tmax 104, starting today. He reports being diagnosed with COVID 10 days ago, taking Paxlovid for 5 days and feeling much better until today when he started having symptoms similar to day 1 of COVID infection 10 days ago. These symptoms are described as feeling "hot", nasal congestion, muffled hearing. No cough, SoB, nausea, diarrhea.   The history is provided by the patient. No language interpreter was used.  Fever Associated symptoms: congestion   Associated symptoms: no cough, no diarrhea, no myalgias, no nausea, no rash and no vomiting       Past Medical History:  Diagnosis Date   Anemia    as child   Anxiety attack    pt. describes several encounters with anxiety, needing immed. attention with visits to the ED.  Pt. reports that he has learned relaxation tecniques that are helpful & he is much more resolved with the anxiety now.    Blood transfusion without reported diagnosis    age 22   Bronchitis    Cancer ([place])    melanoma calf and back, all in situ    Cataract    bilateral    Depression    Glaucoma    Hypertension    Short-term memory loss     Patient Active Problem List   Diagnosis Date Noted   Depression with anxiety 05/21/2013    Past Surgical History:  Procedure Laterality Date   COLONOSCOPY     EXCISION OF KELOID N/A 02/23/2016   Procedure: EXCISION CHEST WALL KELOID;  Surgeon: [name], MD;  Location: [facility];  Service: General;  Laterality: N/A;   MELANOMA EXCISION     x 3 back and calf    POLYPECTOMY         Family History  Problem Relation Age of Onset   Depression Mother    Depression Maternal Grandfather    Colon cancer Neg Hx    Rectal cancer Neg Hx    Stomach cancer Neg Hx     Colon polyps Neg Hx     Social History   Tobacco Use   Smoking status: Never   Smokeless tobacco: Never  Vaping Use   Vaping Use: Never used  Substance Use Topics   Alcohol use: Yes    Alcohol/week: 1.0 - 2.0 standard drink    Types: 1 - 2 Glasses of wine per week    Comment: rare-once every few months maybe   Drug use: No    Home Medications Prior to Admission medications   Medication Sig Start Date End Date Taking? Authorizing Provider  allopurinol (ZYLOPRIM) 100 MG tablet Take 200 mg by mouth every morning.     [provider]  buPROPion (WELLBUTRIN XL) 300 MG 24 hr tablet Take 300 mg by mouth daily with breakfast.     [provider]  Calcium Carbonate-Vit D-Min (CALCIUM 600+D PLUS MINERALS) 600-400 MG-UNIT CHEW Chew 2 tablets every morning by mouth.     [provider]  ciprofloxacin (CIPRO) 500 MG tablet Take 1 tablet (500 mg total) by mouth every 12 (twelve) hours. 07/04/17   Muthersbaugh, Jarrett Soho, PA-C  clonazePAM (KLONOPIN) 0.5 MG tablet Take 0.5 mg 2 (two) times daily as needed  by mouth for anxiety (takes every nighttime.).     [provider]  Coenzyme Q10 (EQL COQ10) 300 MG CAPS Take 300 mg by mouth every morning.     [provider]  dicyclomine (BENTYL) 20 MG tablet Take 1 tablet (20 mg total) 2 (two) times daily by mouth. 06/22/17   Tanna Furry, MD  Ginkgo Biloba Extract (GNP GINGKO BILOBA EXTRACT) 60 MG CAPS Take 120 mg by mouth every morning.     [provider]  Glucosamine-Chondroitin (RA GLUCOSAMINE-CHONDROITIN) 750-600 MG TABS Take 1 tablet by mouth every morning.     [provider]  ketoconazole (NIZORAL) 2 % shampoo APPLY ON THE SKIN AS DIRECTED , LATHER AND LET SIT FOR A FEW MINS PRIOR TO RINSING WHEN YOU SHOWER 05/22/17   [provider]  losartan (COZAAR) 50 MG tablet TAKE 1 TABLET BY MOUTH EVERY MORNING . DISCONTINUE VALSARTAN 04/06/17   [provider]  mirtazapine (REMERON)  7.5 MG tablet Take 7.5 mg by mouth at bedtime.     [provider]  OVER THE COUNTER MEDICATION Take 1 Package by mouth every morning. [place] Performance and Vitality [place].     [provider]  psyllium (HYDROCIL/METAMUCIL) 95 % PACK Take 1 packet by mouth at bedtime.    [provider]  travoprost, benzalkonium, (TRAVATAN) 0.004 % ophthalmic solution Place 1 drop into both eyes at bedtime.    [provider]    Allergies    Bactrim [sulfamethoxazole-trimethoprim], Diphenhydramine, and Doxycycline  Review of Systems   Review of Systems  Constitutional:  Positive for fever.       See HPI.  HENT:  Positive for congestion, hearing loss and sneezing.   Respiratory:  Negative for cough and shortness of breath.   Gastrointestinal:  Negative for abdominal pain, diarrhea, nausea and vomiting.  Musculoskeletal:  Negative for myalgias.  Skin:  Negative for rash.  Neurological:  Negative for weakness.   Physical Exam Updated Vital Signs BP (!) 152/90 (BP Location: Right Arm)   Pulse 92   Temp 99 F (37.2 C) (Oral)   Resp 18   Ht '5\' 10"'$  (1.778 m)   Wt 102.1 kg   SpO2 96%   BMI 32.30 kg/m   Physical Exam Vitals and nursing note reviewed.  Constitutional:      General: He is not in acute distress.    Appearance: Normal appearance. He is well-developed.  HENT:     Head: Normocephalic.  Cardiovascular:     Rate and Rhythm: Normal rate and regular rhythm.     Heart sounds: No murmur heard. Pulmonary:     Effort: Pulmonary effort is normal.     Breath sounds: Normal breath sounds. No wheezing, rhonchi or rales.  Abdominal:     General: Bowel sounds are normal.     Palpations: Abdomen is soft.     Tenderness: There is no abdominal tenderness. There is no guarding or rebound.  Musculoskeletal:        General: Normal range of motion.     Cervical back: Normal range of motion and neck supple.  Skin:    General: Skin is warm and dry.  Neurological:      General: No focal deficit present.     Mental Status: He is alert and oriented to person, place, and time.    ED Results / Procedures / Treatments   Labs (all labs ordered are listed, but only abnormal results are displayed) Labs Reviewed  CBC WITH  DIFFERENTIAL/PLATELET - Abnormal; Notable for the following components:      Result Value   MCHC 36.2 (*)    All other components within normal limits  RESP PANEL BY RT-PCR (FLU A&B, COVID) [id number]  COMPREHENSIVE METABOLIC PANEL  URINALYSIS, ROUTINE W REFLEX MICROSCOPIC    EKG None  Radiology DG Chest Portable 1 View  Result Date: 02/26/2021 CLINICAL DATA:  Fever x1 day. EXAM: PORTABLE CHEST 1 VIEW COMPARISON:  April 10, 2016 FINDINGS: The heart size and mediastinal contours are within normal limits. There is mild calcification of the aortic arch. Both lungs are clear. Degenerative changes seen throughout the thoracic spine. IMPRESSION: No active cardiopulmonary disease. Electronically Signed   By: Virgina Norfolk M.D.   On: 02/26/2021 19:01    Procedures Procedures   Medications Ordered in ED Medications - No data to display  ED Course  I have reviewed the triage vital signs and the nursing notes.  Pertinent labs & imaging results that were available during my care of the patient were reviewed by me and considered in my medical decision making (see chart for details).    MDM Rules/Calculators/A&P                           Patient to ED with concern for re-infection with COVID after having a diagnosis 10 days ago, as detailed in the HPI.   He is overall very well appearing. Lungs clear, no hypoxia or increased work of breathing.   Labs reviewed and are essentially unremarkable. CXR clear. COVID positive. He finished Paxlovid 5 days ago, showing signs of rebound infection. He is stable for discharge home. Discussed return precautions.   Final Clinical Impression(s) / ED Diagnoses Final diagnoses:  None   COVID  infection  Rx / DC Orders ED Discharge Orders     None        Dennie Bible 02/26/21 2103    Isla Pence, MD 02/26/21 2150

## 2021-02-26 NOTE — ED Triage Notes (Signed)
Reports he tested positive 10 days ago for covid.  Reports feeling better after paxlovid.  Reports running a fever today.  Currently 99.0.  now c/o sniffly nose.  Max temp of 100.4 an hour ago.  Took ibuprofen.

## 2021-02-26 NOTE — Discharge Instructions (Addendum)
Take Tylenol and/or ibuprofen for fever. Drink enough fluid to avoid dehydration.   Follow up with your doctor by phone if you have any questions or concerns, and return to the ED with any new or concerning symptoms.

## 2022-02-01 NOTE — Progress Notes (Unsigned)
New Patient Note  RE: ASTER ECKRICH MRN: [id number] DOB: Apr 21, 1946 Date of Office Visit: 02/02/2022  Consult requested by: Calvert Cantor, MD Primary care provider: Aretta Nip, MD  Chief Complaint: No chief complaint on file.  History of Present Illness: I had the pleasure of seeing Tedd Cottrill for initial evaluation at the [facility] on 02/01/2022. He is a 76 y.o. male, who is referred here by Rankins, Bill Salinas, MD for the evaluation of ***.  ***  Assessment and Plan: Vallen is a 76 y.o. male with: No problem-specific Assessment & Plan notes found for this encounter.  No follow-ups on file.  No orders of the defined types were placed in this encounter.  Lab Orders  No laboratory test(s) ordered today    Other allergy screening: Asthma: {Blank single:19197::"yes","no"} Rhino conjunctivitis: {Blank single:19197::"yes","no"} Food allergy: {Blank single:19197::"yes","no"} Medication allergy: {Blank single:19197::"yes","no"} Hymenoptera allergy: {Blank single:19197::"yes","no"} Urticaria: {Blank single:19197::"yes","no"} Eczema:{Blank single:19197::"yes","no"} History of recurrent infections suggestive of immunodeficency: {Blank single:19197::"yes","no"}  Diagnostics: Spirometry:  Tracings reviewed. His effort: {Blank single:19197::"Good reproducible efforts.","It was hard to get consistent efforts and there is a question as to whether this reflects a maximal maneuver.","Poor effort, data can not be interpreted."} FVC: ***L FEV1: ***L, ***% predicted FEV1/FVC ratio: ***% Interpretation: {Blank single:19197::"Spirometry consistent with mild obstructive disease","Spirometry consistent with moderate obstructive disease","Spirometry consistent with severe obstructive disease","Spirometry consistent with possible restrictive disease","Spirometry consistent with mixed obstructive and restrictive disease","Spirometry  uninterpretable due to technique","Spirometry consistent with normal pattern","No overt abnormalities noted given today's efforts"}.  Please see scanned spirometry results for details.  Skin Testing: {Blank single:19197::"Select foods","Environmental allergy panel","Environmental allergy panel and select foods","Food allergy panel","None","Deferred due to recent antihistamines use"}. *** Results discussed with patient/family.   Past Medical History: Patient Active Problem List   Diagnosis Date Noted  . Depression with anxiety 05/21/2013   Past Medical History:  Diagnosis Date  . Anemia    as child  . Anxiety attack    pt. describes several encounters with anxiety, needing immed. attention with visits to the ED.  Pt. reports that he has learned relaxation tecniques that are helpful & he is much more resolved with the anxiety now.   . Blood transfusion without reported diagnosis    age 76  . Bronchitis   . Cancer ([place])    melanoma calf and back, all in situ   . Cataract    bilateral   . Depression   . Glaucoma   . Hypertension   . Short-term memory loss    Past Surgical History: Past Surgical History:  Procedure Laterality Date  . COLONOSCOPY    . EXCISION OF KELOID N/A 02/23/2016   Procedure: EXCISION CHEST WALL KELOID;  Surgeon: [name], MD;  Location: [facility];  Service: General;  Laterality: N/A;  . MELANOMA EXCISION     x 3 back and calf   . POLYPECTOMY     Medication List:  Current Outpatient Medications  Medication Sig Dispense Refill  . allopurinol (ZYLOPRIM) 100 MG tablet Take 200 mg by mouth every morning.     Marland Kitchen buPROPion (WELLBUTRIN XL) 300 MG 24 hr tablet Take 300 mg by mouth daily with breakfast.     . Calcium Carbonate-Vit D-Min (CALCIUM 600+D PLUS MINERALS) 600-400 MG-UNIT CHEW Chew 2 tablets every morning by mouth.     . ciprofloxacin (CIPRO) 500 MG tablet Take 1 tablet (500 mg total) by mouth every 12 (twelve) hours. 20 tablet 0  . clonazePAM (KLONOPIN)  0.5 MG tablet Take 0.5 mg 2 (two) times daily as needed by mouth for anxiety (takes every nighttime.).     Marland Kitchen Coenzyme Q10 (EQL COQ10) 300 MG CAPS Take 300 mg by mouth every morning.     . dicyclomine (BENTYL) 20 MG tablet Take 1 tablet (20 mg total) 2 (two) times daily by mouth. 20 tablet 0  . Ginkgo Biloba Extract (GNP GINGKO BILOBA EXTRACT) 60 MG CAPS Take 120 mg by mouth every morning.     . Glucosamine-Chondroitin (RA GLUCOSAMINE-CHONDROITIN) 750-600 MG TABS Take 1 tablet by mouth every morning.     Marland Kitchen ketoconazole (NIZORAL) 2 % shampoo APPLY ON THE SKIN AS DIRECTED , LATHER AND LET SIT FOR A FEW MINS PRIOR TO RINSING WHEN YOU SHOWER  6  . losartan (COZAAR) 50 MG tablet TAKE 1 TABLET BY MOUTH EVERY MORNING . DISCONTINUE VALSARTAN  1  . mirtazapine (REMERON) 7.5 MG tablet Take 7.5 mg by mouth at bedtime.     Marland Kitchen OVER THE COUNTER MEDICATION Take 1 Package by mouth every morning. [place] Performance and Vitality [place].     . psyllium (HYDROCIL/METAMUCIL) 95 % PACK Take 1 packet by mouth at bedtime.    . travoprost, benzalkonium, (TRAVATAN) 0.004 % ophthalmic solution Place 1 drop into both eyes at bedtime.     No current facility-administered medications for this visit.   Allergies: Allergies  Allergen Reactions  . Bactrim [Sulfamethoxazole-Trimethoprim] Shortness Of Breath, Diarrhea, Nausea Only and Other (See Comments)    Experienced fever  . Diphenhydramine Shortness Of Breath  . Doxycycline Other (See Comments)    Dizziness and inflammation of right hip joint   Social History: Social History   Socioeconomic History  . Marital status: Married    Spouse name: Not on file  . Number of children: Not on file  . Years of education: Not on file  . Highest education level: Not on file  Occupational History  . Not on file  Tobacco Use  . Smoking status: Never  . Smokeless tobacco: Never  Vaping Use  . Vaping Use: Never used  Substance and Sexual Activity  . Alcohol use: Yes     Alcohol/week: 1.0 - 2.0 standard drink of alcohol    Types: 1 - 2 Glasses of wine per week    Comment: rare-once every few months maybe  . Drug use: No  . Sexual activity: Never  Other Topics Concern  . Not on file  Social History Narrative  . Not on file   Social Determinants of Health   Financial Resource Strain: Not on file  Food Insecurity: Not on file  Transportation Needs: Not on file  Physical Activity: Not on file  Stress: Not on file  Social Connections: Not on file   Lives in a ***. Smoking: *** Occupation: ***  Environmental HistoryFreight forwarder in the house: Estate agent in the family room: {Blank single:19197::"yes","no"} Carpet in the bedroom: {Blank single:19197::"yes","no"} Heating: {Blank single:19197::"electric","gas","heat pump"} Cooling: {Blank single:19197::"central","window","heat pump"} Pet: {Blank single:19197::"yes ***","no"}  Family History: Family History  Problem Relation Age of Onset  . Depression Mother   . Depression Maternal Grandfather   . Colon cancer Neg Hx   . Rectal cancer Neg Hx   . Stomach cancer Neg Hx   . Colon polyps Neg Hx    Problem  Relation Asthma                                   *** Eczema                                *** Food allergy                          *** Allergic rhino conjunctivitis     ***  Review of Systems  Constitutional:  Negative for appetite change, chills, fever and unexpected weight change.  HENT:  Negative for congestion and rhinorrhea.   Eyes:  Negative for itching.  Respiratory:  Negative for cough, chest tightness, shortness of breath and wheezing.   Cardiovascular:  Negative for chest pain.  Gastrointestinal:  Negative for abdominal pain.  Genitourinary:  Negative for difficulty urinating.  Skin:  Negative for rash.  Neurological:  Negative for headaches.   Objective: There were no vitals taken for this visit. There is no  height or weight on file to calculate BMI. Physical Exam Vitals and nursing note reviewed.  Constitutional:      Appearance: Normal appearance. He is well-developed.  HENT:     Head: Normocephalic and atraumatic.     Right Ear: Tympanic membrane and external ear normal.     Left Ear: Tympanic membrane and external ear normal.     Nose: Nose normal.     Mouth/Throat:     Mouth: Mucous membranes are moist.     Pharynx: Oropharynx is clear.  Eyes:     Conjunctiva/sclera: Conjunctivae normal.  Cardiovascular:     Rate and Rhythm: Normal rate and regular rhythm.     Heart sounds: Normal heart sounds. No murmur heard.    No friction rub. No gallop.  Pulmonary:     Effort: Pulmonary effort is normal.     Breath sounds: Normal breath sounds. No wheezing, rhonchi or rales.  Musculoskeletal:     Cervical back: Neck supple.  Skin:    General: Skin is warm.     Findings: No rash.  Neurological:     Mental Status: He is alert and oriented to person, place, and time.  Psychiatric:        Behavior: Behavior normal.  The plan was reviewed with the patient/family, and all questions/concerned were addressed.  It was my pleasure to see Brian Maxwell today and participate in his care. Please feel free to contact me with any questions or concerns.  Sincerely,  Rexene Alberts, DO Allergy & Immunology  [facility] office: [place] office: [phone]

## 2022-02-02 ENCOUNTER — Ambulatory Visit (INDEPENDENT_AMBULATORY_CARE_PROVIDER_SITE_OTHER): Payer: Medicare PPO | Admitting: Allergy

## 2022-02-02 ENCOUNTER — Encounter: Payer: Self-pay | Admitting: Allergy

## 2022-02-02 VITALS — BP 130/70 | HR 91 | Temp 98.5°F | Resp 16 | Ht 70.0 in | Wt 215.8 lb

## 2022-02-02 DIAGNOSIS — T50995D Adverse effect of other drugs, medicaments and biological substances, subsequent encounter: Secondary | ICD-10-CM | POA: Insufficient documentation

## 2022-02-02 DIAGNOSIS — Z91038 Other insect allergy status: Secondary | ICD-10-CM

## 2022-02-02 NOTE — Assessment & Plan Note (Signed)
Hypotension in 2001 after possible yellow jacket stings x 4.  Not sure if EpiPen was administered but was taken to the ER for IV fluids.  No allergy evaluation at that time.  Recently saw allergist at [facility] who did blood work and was recommend to follow up with [place] for further evaluation - requesting records. . Skin testing today was negative fire ant.  . Continue avoidance of stinging insects and carry epinephrine with you at all times especially when outdoors.  . Consider getting a medical alert bracelet. . Requesting records from previous allergist. . Depending on results will either have you get additional testing. Either skin testing at our [facility] office or bloodwork. o Do not get bloodwork drawn until you hear back from our office. Lab orders given today. . If you have significant positive then will recommend allergy injections next.  . Demonstrated proper use of epinephrine.  . For mild symptoms you can take over the counter antihistamines such as zyrtec '10mg'$  every 12 hours and monitor symptoms closely. If symptoms worsen or if you have severe symptoms including breathing issues, throat closure, significant swelling, whole body hives, severe diarrhea and vomiting, lightheadedness then inject epinephrine and seek immediate medical care afterwards. . Emergency action plan given.

## 2022-02-02 NOTE — Patient Instructions (Addendum)
Stinging insects Skin testing today was negative fire ant.  Continue avoidance and carry epinephrine with you at all times especially when outdoors.  Consider getting a medical alert bracelet for this.   Requesting records from previous allergist. Depending on results will either have you get additional testing. Either skin testing at our [facility] office or bloodwork. Do not get bloodwork drawn until you hear back from our office. Lab orders given today.  If you have significant positive then will recommend allergy injections next.   Demonstrated proper use of epinephrine.  For mild symptoms you can take over the counter antihistamines such as zyrtec '10mg'$  every 12 hours and monitor symptoms closely. If symptoms worsen or if you have severe symptoms including breathing issues, throat closure, significant swelling, whole body hives, severe diarrhea and vomiting, lightheadedness then inject epinephrine and seek immediate medical care afterwards. Emergency action plan given.  Drug allergy It is rare to develop a benadryl allergy. Continue to avoid for now. If interested we can schedule drug challenge to benadryl. You must be off antihistamines for 3-5 days before. Must be in good health and not ill. No vaccines/injections/antibiotics within the past 7 days. Plan on being in the office for 2-3 hours and must bring in the drug you want to do the oral challenge for - will send in prescription to pick up a few days before. You must call to schedule an appointment and specify it's for a drug challenge.  Follow up in 6 months or sooner if needed.

## 2022-02-02 NOTE — Assessment & Plan Note (Signed)
Patient mentions developing shortness of breath after persistent Benadryl use in the past.  Last Benadryl use was over 10 years ago.  Does not recall taking any antihistamines recently.  Discussed with patient that it is rare to develop an allergy to Benadryl. . Continue to avoid for now. . If interested we can schedule drug challenge to benadryl. You must be off antihistamines for 3-5 days before. Must be in good health and not ill. No vaccines/injections/antibiotics within the past 7 days. Plan on being in the office for 2-3 hours and must bring in the drug you want to do the oral challenge for - will send in prescription to pick up a few days before. You must call to schedule an appointment and specify it's for a drug challenge.

## 2022-02-14 ENCOUNTER — Telehealth: Payer: Self-pay | Admitting: Allergy

## 2022-02-14 ENCOUNTER — Encounter: Payer: Self-pay | Admitting: Allergy

## 2022-02-14 DIAGNOSIS — Z91038 Other insect allergy status: Secondary | ICD-10-CM

## 2022-02-14 NOTE — Telephone Encounter (Signed)
Please call patient.  I reviewed the records - it was borderline positive to white face hornet, yellow jacket and wasp.   Given these results, I recommend that he gets skin testing done at our [facility] office. Please schedule him for skin testing on one of the venom testing days - July 18th Tuesday is the next day.  After the results, will recommend starting on injections for this.   He needs to get only one of the bloodwork drawn called tryptase level.  We will mail to his home. He does not need to get the bloodwork drawn that I gave him at the last office visit - we don't need to redraw the hymenoptera panel.

## 2022-02-14 NOTE — Progress Notes (Signed)
Reviewed notes from  [facility] . Date of service: multiple. See scanned notes for full documentation. 12/06/2021 bloodwork White faced hornet IgE 0.26 Yellowjacket IgE 0.34 Paper wasp IgE 0.26 Negative to honeybee and yellow hornet. Negative to environmental allergy panel.

## 2022-02-14 NOTE — Telephone Encounter (Signed)
Spoke to patient and informed him of the labs and also scheduled him for venom testing on July 18th at 10:00 am at our [facility] office. I mailed the requisition along with an appointment card with the address and phone number to [facility].    Hart Carwin [phone]

## 2022-02-21 ENCOUNTER — Encounter: Payer: Self-pay | Admitting: Gastroenterology

## 2022-02-21 ENCOUNTER — Ambulatory Visit: Payer: Medicare PPO | Admitting: Family

## 2022-02-21 ENCOUNTER — Encounter: Payer: Self-pay | Admitting: Family

## 2022-02-21 VITALS — BP 128/86 | HR 67 | Temp 97.5°F | Resp 20

## 2022-02-21 DIAGNOSIS — Z91038 Other insect allergy status: Secondary | ICD-10-CM

## 2022-02-21 NOTE — Progress Notes (Signed)
[place] [address] Dept: [phone]  FOLLOW UP NOTE  Patient ID: Brian Maxwell, male    DOB: [birth year]  Age: 76 y.o. MRN: [id number] Date of Office Visit: 02/21/2022  Assessment  Chief Complaint: Allergy Testing (Venom )  HPI Brian Maxwell is a 76 year old male who presents today for venom skin testing.  He was last seen on February 02, 2022 by Dr. Maudie Mercury for hymenoptera allergy and adverse effect of other drugs.  He denies any new diagnosis or surgery since his last office visit.  He reports that in 2001 while he was outdoors mowing his grass.  He got stung by possibly yellow jackets (4 stings).  In less than 30 seconds he dropped to the ground.  EMS was called and his blood pressure was low (80/40).  He was taken to the emergency room and is not sure if he was had epinephrine given, but does remember getting IV fluids.  He he is also not sure if this reaction may have been due to to dehydration.  He has been off all antihistamines and reports that he does not take any antihistamines ever.  He is in good health today and denies any cardiorespiratory, gastrointestinal, and cutaneous symptoms.  All questions answered and informed consent signed.   Drug Allergies:  Allergies  Allergen Reactions   Bactrim [Sulfamethoxazole-Trimethoprim] Shortness Of Breath, Diarrhea, Nausea Only and Other (See Comments)    Experienced fever   Diphenhydramine Shortness Of Breath   Doxycycline Other (See Comments)    Dizziness and inflammation of right hip joint    Review of Systems: Review of Systems  Constitutional:  Negative for chills and fever.  HENT:         Denies rhinorrhea, nasal congestion, and postnasal drip  Eyes:        Denies itchy watery eyes  Respiratory:         Denies cough, wheeze, tightness in chest, and shortness of breath  Cardiovascular:  Negative for chest pain and palpitations.  Gastrointestinal:  Negative for abdominal pain, diarrhea, nausea  and vomiting.  Genitourinary:  Negative for frequency.  Skin:  Negative for itching and rash.  Neurological:  Negative for headaches.     Physical Exam: BP 128/86   Pulse 67   Temp (!) 97.5 F (36.4 C) (Temporal)   Resp 20   SpO2 96%    Physical Exam Constitutional:      Appearance: Normal appearance.  HENT:     Head: Normocephalic and atraumatic.     Comments: Pharynx normal, eyes normal, ears normal, nose normal    Right Ear: Tympanic membrane, ear canal and external ear normal.     Left Ear: Tympanic membrane, ear canal and external ear normal.     Nose: Nose normal.     Mouth/Throat:     Mouth: Mucous membranes are moist.     Pharynx: Oropharynx is clear.  Eyes:     Conjunctiva/sclera: Conjunctivae normal.  Cardiovascular:     Rate and Rhythm: Normal rate and regular rhythm.     Heart sounds: Normal heart sounds.  Pulmonary:     Effort: Pulmonary effort is normal.     Breath sounds: Normal breath sounds.     Comments: Lungs clear to auscultation Musculoskeletal:     Cervical back: Neck supple.  Skin:    General: Skin is warm.     Comments: No rashes or urticarial lesions noted  Neurological:     Mental  Status: He is alert and oriented to person, place, and time.  Psychiatric:        Mood and Affect: Mood normal.        Behavior: Behavior normal.        Thought Content: Thought content normal.        Judgment: Judgment normal.     Diagnostics:    Venom Testing - 02/21/22 1144     Time Antigen Placed 1037    Location Arm    Number of Test 22    Control Negative   puncture 1.0 negative   Histamine 3+   histamine 3+   Honey Bee Negative   puncture 1.0 ug/ml negative   Yellow Hornet Negative   puncture 1.0 ug/ml negative   White Hornet Negative   puncture 1.0 ug/ml negative   Wasp Negative   puncture 1.0 ug/ml negative   Control Negative   intradermal control negative   Honey Bee Negative   intradermal 0.001 negative   Yellow Hornet Negative    intradermal 0.001 negative   White Hornet Negative   intradermal 0.001 negative   Wasp Negative   intradermal 0.001 negative   Honey Bee Negative   intradermal 0.01 ug/ml negative   Yellow Jacket Negative   intradermal 0.01 ug/ml negative   Yellow Hornet Negative   intradermal 0.01 ug/ml negative   White Hornet Negative   intradermal 0.01 ug/ml negative   Wasp Negative   intradermal 0.01 ug/ml negative   Honey Bee Negative    intradermal 0.1 ug/ml negative   Yellow Hornet Negative   intradermal 0.1 ug/ml negative   White Hornet Negative   intradermal 0.1 ug/ml negative   Wasp Negative   intradermal 0.1 ug/ml negative   Honey Bee Negative   intradermal 1.0 negative   Yellow Hornet Negative   intradermal 1.0 negative   White Hornet Negative   intradermal 1.0 negative   Wasp Negative   intradermal 1.0 negative             Assessment and Plan: 1. Hymenoptera allergy     No orders of the defined types were placed in this encounter.   Patient Instructions  Stinging insects Skin testing on 02/02/22 was negative fire ant.  Skin testing today was if to honeybee, yellow hornet, white hornet, and wasp Recommend venom immunotherapy to help prevent further anaphylactic reactions.  Informed consent signed.  CPT code given to call insurance to find out cost.  When she find out cost and if you are interested call our office to start injections. Continue avoidance and carry epinephrine with you at all times especially when outdoors.  Recommend getting a medical alert bracelet for this.  Demonstrated proper use of epinephrine at last office visit.  For mild symptoms you can take over the counter antihistamines such as zyrtec '10mg'$  every 12 hours and monitor symptoms closely. If symptoms worsen or if you have severe symptoms including breathing issues, throat closure, significant swelling, whole body hives, severe diarrhea and vomiting, lightheadedness then inject epinephrine and seek immediate  medical care afterwards. Emergency action plan given at last office visit Get tryptase level as ordered by Dr. Maudie Mercury.  We call you with results once they are back.   Keep already scheduled follow-up appointment on August 10, 2022 at 4 PM Dr. Maudie Mercury  Return in about 6 months (around 08/10/2022), or if symptoms worsen or fail to improve.    Thank you for the opportunity to care for this patient.  Please do not hesitate to contact me with questions.  Althea Charon, FNP [facility] [place]

## 2022-02-21 NOTE — Patient Instructions (Addendum)
Stinging insects Skin testing on 02/02/22 was negative fire ant.  Skin testing today was if to honeybee, yellow hornet, white hornet, and wasp Recommend venom immunotherapy to help prevent further anaphylactic reactions.  Informed consent signed.  CPT code given to call insurance to find out cost.  When she find out cost and if you are interested call our office to start injections. Continue avoidance and carry epinephrine with you at all times especially when outdoors.  Recommend getting a medical alert bracelet for this.  Demonstrated proper use of epinephrine at last office visit.  For mild symptoms you can take over the counter antihistamines such as zyrtec '10mg'$  every 12 hours and monitor symptoms closely. If symptoms worsen or if you have severe symptoms including breathing issues, throat closure, significant swelling, whole body hives, severe diarrhea and vomiting, lightheadedness then inject epinephrine and seek immediate medical care afterwards. Emergency action plan given at last office visit Get tryptase level as ordered by Dr. Maudie Mercury.  We call you with results once they are back.   Keep already scheduled follow-up appointment on August 10, 2022 at 4 PM Dr. Maudie Mercury

## 2022-03-22 ENCOUNTER — Ambulatory Visit (AMBULATORY_SURGERY_CENTER): Payer: Self-pay | Admitting: *Deleted

## 2022-03-22 ENCOUNTER — Other Ambulatory Visit: Payer: Self-pay

## 2022-03-22 VITALS — Ht 70.0 in | Wt 220.0 lb

## 2022-03-22 DIAGNOSIS — Z86010 Personal history of colonic polyps: Secondary | ICD-10-CM

## 2022-03-22 MED ORDER — NA SULFATE-K SULFATE-MG SULF 17.5-3.13-1.6 GM/177ML PO SOLN: 1.0000 | Freq: Once | ORAL | 0 refills | Status: AC

## 2022-03-22 NOTE — Progress Notes (Signed)
Pre visit completed in person. Patient given singlecare coupon    No egg or soy allergy known to patient  No issues known to pt with past sedation with any surgeries or procedures Patient denies ever being told they had issues or difficulty with intubation  No FH of Malignant Hyperthermia Pt is not on diet pills Pt is not on  home 02  Pt is not on blood thinners  Pt denies issues with constipation  No A fib or A flutter Pt instructed to use [email or web address].com or GoodRx for a price reduction on prep

## 2022-03-30 ENCOUNTER — Encounter: Payer: Self-pay | Admitting: Gastroenterology

## 2022-04-12 ENCOUNTER — Encounter: Payer: Self-pay | Admitting: Gastroenterology

## 2022-04-12 ENCOUNTER — Ambulatory Visit (AMBULATORY_SURGERY_CENTER): Payer: Medicare PPO | Admitting: Gastroenterology

## 2022-04-12 VITALS — BP 117/71 | HR 65 | Temp 96.2°F | Resp 10 | Ht 70.0 in | Wt 220.0 lb

## 2022-04-12 DIAGNOSIS — Z09 Encounter for follow-up examination after completed treatment for conditions other than malignant neoplasm: Secondary | ICD-10-CM

## 2022-04-12 DIAGNOSIS — Z86010 Personal history of colonic polyps: Secondary | ICD-10-CM

## 2022-04-12 MED ORDER — SODIUM CHLORIDE 0.9 % IV SOLN: 500.0000 mL | Freq: Once | INTRAVENOUS | Status: DC

## 2022-04-12 NOTE — Patient Instructions (Addendum)
Handout on hemorrhoids, high fiber diet, and diverticulosis given to patient. Resume previous diet and continue present medications. No repeat colonoscopy for surveillance due to age unless new symptoms arise.    YOU HAD AN ENDOSCOPIC PROCEDURE TODAY AT [facility]:   Refer to the procedure report that was given to you for any specific questions about what was found during the examination.  If the procedure report does not answer your questions, please call your gastroenterologist to clarify.  If you requested that your care partner not be given the details of your procedure findings, then the procedure report has been included in a sealed envelope for you to review at your convenience later.  YOU SHOULD EXPECT: Some feelings of bloating in the abdomen. Passage of more gas than usual.  Walking can help get rid of the air that was put into your GI tract during the procedure and reduce the bloating. If you had a lower endoscopy (such as a colonoscopy or flexible sigmoidoscopy) you may notice spotting of blood in your stool or on the toilet paper. If you underwent a bowel prep for your procedure, you may not have a normal bowel movement for a few days.  Please Note:  You might notice some irritation and congestion in your nose or some drainage.  This is from the oxygen used during your procedure.  There is no need for concern and it should clear up in a day or so.  SYMPTOMS TO REPORT IMMEDIATELY:  Following lower endoscopy (colonoscopy or flexible sigmoidoscopy):  Excessive amounts of blood in the stool  Significant tenderness or worsening of abdominal pains  Swelling of the abdomen that is new, acute  Fever of 100F or higher  For urgent or emergent issues, a gastroenterologist can be reached at any hour by calling [phone]. Do not use MyChart messaging for urgent concerns.    DIET:  We do recommend a small meal at first, but then you may proceed to your regular diet.   Drink plenty of fluids but you should avoid alcoholic beverages for 24 hours.  ACTIVITY:  You should plan to take it easy for the rest of today and you should NOT DRIVE or use heavy machinery until tomorrow (because of the sedation medicines used during the test).    FOLLOW UP: Our staff will call the number listed on your records the next business day following your procedure.  We will call around 7:15- 8:00 am to check on you and address any questions or concerns that you may have regarding the information given to you following your procedure. If we do not reach you, we will leave a message.  If you develop any symptoms (ie: fever, flu-like symptoms, shortness of breath, cough etc.) before then, please call [phone].  If you test positive for Covid 19 in the 2 weeks post procedure, please call and report this information to [place].    If any biopsies were taken you will be contacted by phone or by letter within the next 1-3 weeks.  Please call us at [phone] if you have not heard about the biopsies in 3 weeks.    SIGNATURES/CONFIDENTIALITY: You and/or your care partner have signed paperwork which will be entered into your electronic medical record.  These signatures attest to the fact that that the information above on your After Visit Summary has been reviewed and is understood.  Full responsibility of the confidentiality of this discharge information lies with you and/or your care-partner.

## 2022-04-12 NOTE — Progress Notes (Signed)
History & Physical  Primary Care Physician:  Sheran Spine, FNP Primary Gastroenterologist: Lucio Edward, MD  CHIEF COMPLAINT:  Personal history of colon polyps   HPI: Brian Maxwell is a 76 y.o. male with a personal history of adenomatous colon polyps for surveillance colonoscopy.   Past Medical History:  Diagnosis Date   Anemia    as child   Anxiety attack    pt. describes several encounters with anxiety, needing immed. attention with visits to the ED.  Pt. reports that he has learned relaxation tecniques that are helpful & he is much more resolved with the anxiety now.    Blood transfusion without reported diagnosis    age 80   Bronchitis    Cancer ([place])    melanoma calf and back, all in situ    Cataract    bilateral    Depression    Glaucoma    Hypertension    Short-term memory loss     Past Surgical History:  Procedure Laterality Date   COLONOSCOPY     EXCISION OF KELOID N/A 02/23/2016   Procedure: EXCISION CHEST WALL KELOID;  Surgeon: [name], MD;  Location: [facility];  Service: General;  Laterality: N/A;   MELANOMA EXCISION     x 3 back and calf    POLYPECTOMY      Prior to Admission medications   Medication Sig Start Date End Date Taking? Authorizing Provider  Acetaminophen (ACETAMINOPHEN EXTRA STRENGTH) 500 MG capsule 1 capsule as needed   Yes [provider]  allopurinol (ZYLOPRIM) 100 MG tablet Take 200 mg by mouth every morning.    Yes [provider]  buPROPion (WELLBUTRIN XL) 300 MG 24 hr tablet Take 300 mg by mouth daily with breakfast.    Yes [provider]  clonazePAM (KLONOPIN) 0.5 MG tablet Take 0.5 mg 2 (two) times daily as needed by mouth for anxiety (takes every nighttime.).    Yes [provider]  Coenzyme Q10 (EQL COQ10) 300 MG CAPS Take 300 mg by mouth every morning.    Yes [provider]  Ginkgo Biloba Extract (GNP GINGKO BILOBA EXTRACT) 60 MG CAPS Take 120 mg by mouth every morning.     Yes [provider]  Glucosamine-Chondroitin (RA GLUCOSAMINE-CHONDROITIN) 750-600 MG TABS Take 1 tablet by mouth every morning.    Yes [provider]  hydrocortisone 2.5 % cream Apply 1 Application topically 4 (four) times daily. 12/21/21  Yes [provider]  Magnesium Citrate 125 MG CAPS Take by mouth.   Yes [provider]  mirtazapine (REMERON) 15 MG tablet Take 7.5 mg by mouth at bedtime. 02/04/22  Yes [provider]  Omega-3 Fatty Acids (FISH OIL) 1000 MG CAPS SMARTSIG:1 Capsule(s) By Mouth 12/21/21  Yes [provider]  valsartan (DIOVAN) 40 MG tablet Take by mouth. 12/21/21  Yes [provider]  azithromycin (ZITHROMAX) 500 MG tablet Take 1,000 mg by mouth once. Patient not taking: Reported on 04/12/2022 02/21/22   [provider]  EPINEPHrine 0.3 mg/0.3 mL IJ SOAJ injection SMARTSIG:IM As Directed PRN 12/27/21   [provider]  meloxicam (MOBIC) 15 MG tablet Take 15 mg by mouth daily as needed. 01/12/22   [provider]  predniSONE (STERAPRED UNI-PAK 21 TAB) 10 MG (21) TBPK tablet Take 10 mg by mouth as directed. 03/20/22   [provider]  TOBRADEX ST 0.3-0.05 % SUSP Apply 1 drop to eye 3 (three) times daily. Patient not taking: Reported on  04/12/2022 02/20/22   [provider]  travoprost, benzalkonium, (TRAVATAN) 0.004 % ophthalmic solution Place 1 drop into both eyes at bedtime. Patient not taking: Reported on 04/12/2022    [provider]    Current Outpatient Medications  Medication Sig Dispense Refill   Acetaminophen (ACETAMINOPHEN EXTRA STRENGTH) 500 MG capsule 1 capsule as needed     allopurinol (ZYLOPRIM) 100 MG tablet Take 200 mg by mouth every morning.      buPROPion (WELLBUTRIN XL) 300 MG 24 hr tablet Take 300 mg by mouth daily with breakfast.      clonazePAM (KLONOPIN) 0.5 MG tablet Take 0.5 mg 2 (two) times daily as needed by mouth for anxiety (takes every  nighttime.).      Coenzyme Q10 (EQL COQ10) 300 MG CAPS Take 300 mg by mouth every morning.      Ginkgo Biloba Extract (GNP GINGKO BILOBA EXTRACT) 60 MG CAPS Take 120 mg by mouth every morning.      Glucosamine-Chondroitin (RA GLUCOSAMINE-CHONDROITIN) 750-600 MG TABS Take 1 tablet by mouth every morning.      hydrocortisone 2.5 % cream Apply 1 Application topically 4 (four) times daily.     Magnesium Citrate 125 MG CAPS Take by mouth.     mirtazapine (REMERON) 15 MG tablet Take 7.5 mg by mouth at bedtime.     Omega-3 Fatty Acids (FISH OIL) 1000 MG CAPS SMARTSIG:1 Capsule(s) By Mouth     valsartan (DIOVAN) 40 MG tablet Take by mouth.     azithromycin (ZITHROMAX) 500 MG tablet Take 1,000 mg by mouth once. (Patient not taking: Reported on 04/12/2022)     EPINEPHrine 0.3 mg/0.3 mL IJ SOAJ injection SMARTSIG:IM As Directed PRN     meloxicam (MOBIC) 15 MG tablet Take 15 mg by mouth daily as needed.     predniSONE (STERAPRED UNI-PAK 21 TAB) 10 MG (21) TBPK tablet Take 10 mg by mouth as directed.     TOBRADEX ST 0.3-0.05 % SUSP Apply 1 drop to eye 3 (three) times daily. (Patient not taking: Reported on 04/12/2022)     travoprost, benzalkonium, (TRAVATAN) 0.004 % ophthalmic solution Place 1 drop into both eyes at bedtime. (Patient not taking: Reported on 04/12/2022)     Current Facility-Administered Medications  Medication Dose Route Frequency Provider Last Rate Last Admin   0.9 %  sodium chloride infusion  500 mL Intravenous Once Ladene Artist, MD        Allergies as of 04/12/2022 - Review Complete 04/12/2022  Allergen Reaction Noted   Bactrim [sulfamethoxazole-trimethoprim] Shortness Of Breath, Diarrhea, Nausea Only, and Other (See Comments) 04/12/2016   Bee venom Anaphylaxis 04/12/2022   Doxycycline Other (See Comments) 02/21/2016    Family History  Problem Relation Age of Onset   Depression Mother    Depression Maternal Grandfather    Colon cancer Neg Hx    Rectal cancer Neg Hx    Stomach  cancer Neg Hx    Colon polyps Neg Hx    Esophageal cancer Neg Hx     Social History   Socioeconomic History   Marital status: Married    Spouse name: Not on file   Number of children: Not on file   Years of education: Not on file   Highest education level: Not on file  Occupational History   Not on file  Tobacco Use   Smoking status: Never   Smokeless tobacco: Never  Vaping Use   Vaping Use: Never used  Substance and Sexual Activity  Alcohol use: Not Currently   Drug use: No   Sexual activity: Never  Other Topics Concern   Not on file  Social History Narrative   Not on file   Social Determinants of Health   Financial Resource Strain: Not on file  Food Insecurity: Not on file  Transportation Needs: Not on file  Physical Activity: Not on file  Stress: Not on file  Social Connections: Not on file  Intimate Partner Violence: Not on file    Review of Systems:  All systems reviewed were negative except where noted in HPI.   Physical Exam: General:  Alert, well-developed, in NAD Head:  Normocephalic and atraumatic. Eyes:  Sclera clear, no icterus.   Conjunctiva pink. Ears:  Normal auditory acuity. Mouth:  No deformity or lesions.  Neck:  Supple; no masses . Lungs:  Clear throughout to auscultation.   No wheezes, crackles, or rhonchi. No acute distress. Heart:  Regular rate and rhythm; no murmurs. Abdomen:  Soft, nondistended, nontender. No masses, hepatomegaly. No obvious masses.  Normal bowel .    Rectal:  Deferred   Msk:  Symmetrical without gross deformities.. Pulses:  Normal pulses noted. Extremities:  Without edema. Neurologic:  Alert and  oriented x4;  grossly normal neurologically. Skin:  Intact without significant lesions or rashes. Cervical Nodes:  No significant cervical adenopathy. Psych:  Alert and cooperative. Normal mood and affect.  Impression / Plan:   Personal history of adenomatous colon polyps for surveillance colonoscopy.  Pricilla Riffle.  Fuller Plan  04/12/2022, 9:08 AM See Shea Evans, [facility], to contact our on call provider

## 2022-04-12 NOTE — Progress Notes (Signed)
Pt's states no medical or surgical changes since previsit or office visit. 

## 2022-04-12 NOTE — Op Note (Signed)
[facility] Patient Name: Brian Maxwell Procedure Date: 04/12/2022 9:17 AM MRN: [id number] Endoscopist: Ladene Artist , MD Age: 76 Referring MD:  Date of Birth: January 09, 1946 Gender: Male Account #: [id number] Procedure:                Colonoscopy Indications:              Surveillance: Personal history of adenomatous                            polyps on last colonoscopy > 5 years ago Medicines:                Monitored Anesthesia Care Procedure:                Pre-Anesthesia Assessment:                           - Prior to the procedure, a History and Physical                            was performed, and patient medications and                            allergies were reviewed. The patient's tolerance of                            previous anesthesia was also reviewed. The risks                            and benefits of the procedure and the sedation                            options and risks were discussed with the patient.                            All questions were answered, and informed consent                            was obtained. Prior Anticoagulants: The patient has                            taken no previous anticoagulant or antiplatelet                            agents. ASA Grade Assessment: II - A patient with                            mild systemic disease. After reviewing the risks                            and benefits, the patient was deemed in                            satisfactory condition to undergo the procedure.  After obtaining informed consent, the colonoscope                            was passed under direct vision. Throughout the                            procedure, the patient's blood pressure, pulse, and                            oxygen saturations were monitored continuously. The                            CF [id number] #[id number] was introduced through the anus                            and advanced to  the the cecum, identified by                            appendiceal orifice and ileocecal valve. The                            ileocecal valve, appendiceal orifice, and rectum                            were photographed. The quality of the bowel                            preparation was good. The colonoscopy was performed                            without difficulty. The patient tolerated the                            procedure well. Scope In: 9:24:38 AM Scope Out: 9:38:32 AM Scope Withdrawal Time: 0 hours 10 minutes 3 seconds  Total Procedure Duration: 0 hours 13 minutes 54 seconds  Findings:                 The perianal and digital rectal examinations were                            normal.                           A few small-mouthed diverticula were found in the                            left colon.                           Internal hemorrhoids were found during                            retroflexion. The hemorrhoids were small and Grade  I (internal hemorrhoids that do not prolapse).                           The exam was otherwise without abnormality on                            direct and retroflexion views. Complications:            No immediate complications. Estimated blood loss:                            None. Estimated Blood Loss:     Estimated blood loss: none. Impression:               - Mild diverticulosis in the left colon.                           - Internal hemorrhoids.                           - The examination was otherwise normal on direct                            and retroflexion views.                           - No specimens collected. Recommendation:           - Patient has a contact number available for                            emergencies. The signs and symptoms of potential                            delayed complications were discussed with the                            patient. Return to normal activities  tomorrow.                            Written discharge instructions were provided to the                            patient.                           - High fiber diet.                           - Continue present medications.                           - No repeat colonoscopy due to age and the absence                            of colonic polyps. Ladene Artist, MD 04/12/2022 9:42:03 AM This report has been signed electronically.

## 2022-04-12 NOTE — Progress Notes (Signed)
A and O x3. Report to RN. Tolerated MAC anesthesia well. 

## 2022-04-13 ENCOUNTER — Telehealth: Payer: Self-pay

## 2022-04-13 NOTE — Telephone Encounter (Signed)
Left message on follow up call. 

## 2022-05-04 ENCOUNTER — Other Ambulatory Visit: Payer: Self-pay

## 2022-05-04 ENCOUNTER — Emergency Department (HOSPITAL_BASED_OUTPATIENT_CLINIC_OR_DEPARTMENT_OTHER)
Admission: EM | Admit: 2022-05-04 | Discharge: 2022-05-04 | Disposition: A | Discharge status: Home / Self Care | Payer: Medicare PPO | Attending: Emergency Medicine | Admitting: Emergency Medicine

## 2022-05-04 ENCOUNTER — Encounter (HOSPITAL_BASED_OUTPATIENT_CLINIC_OR_DEPARTMENT_OTHER): Payer: Self-pay | Admitting: Emergency Medicine

## 2022-05-04 DIAGNOSIS — Z5321 Procedure and treatment not carried out due to patient leaving prior to being seen by health care provider: Secondary | ICD-10-CM | POA: Insufficient documentation

## 2022-05-04 DIAGNOSIS — M79604 Pain in right leg: Secondary | ICD-10-CM | POA: Diagnosis present

## 2022-05-04 NOTE — ED Notes (Signed)
Pt states he has to leave at this time. He will come another day

## 2022-05-04 NOTE — ED Triage Notes (Signed)
Patient presents C/O R leg pain that has been worsening over the past 6 months. Patient has detailed paper with what has been occurring. Already scheduled to have MRI tomorrow. Here requesting another opinion.

## 2022-06-01 IMAGING — CT CT CHEST W/ CM
4 of 6 series · 14 of 36 positions shown, 16 images · IV contrast (iopamidol)
Comparison: 04/12/2016.

CLINICAL DATA: Right posterior shoulder mass.  History of melanoma.

EXAM:
CT CHEST WITH CONTRAST
TECHNIQUE: Multidetector CT imaging of the chest was performed during
intravenous contrast administration.
CONTRAST:  75mL [id number]-PCC IOPAMIDOL (ZZ5VL0-PCC) INJECTION 61%

[Series 2: chest with 5.00 br40 s3 axial · axial · 0.90mm/px · z∈[-1035,-880]mm · 3 of 63 slices shown]
[im 16/63  lung]
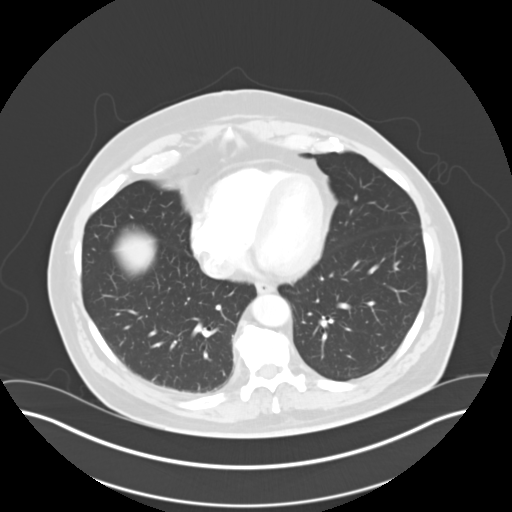
[im 32/63  lung]
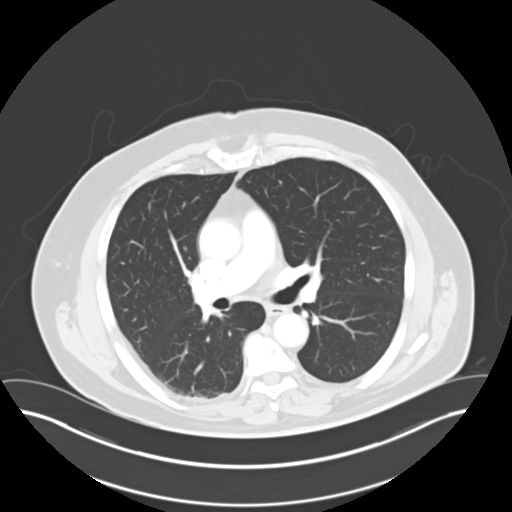
[im 47/63  lung]
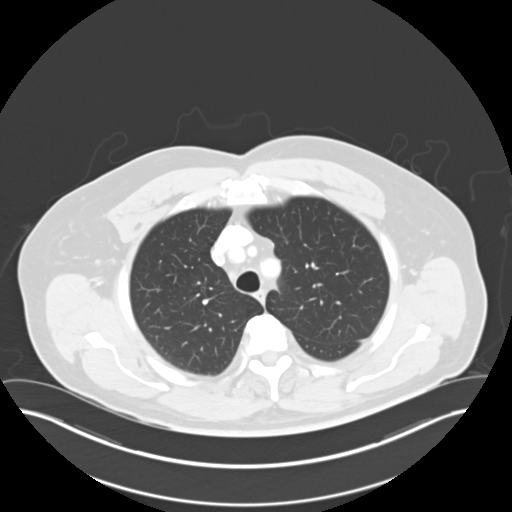

[Series 4: chest with 2.00 br60 s3 axial · axial · 0.90mm/px · z∈[-1076,-838]mm · 7 of 159 slices shown, 9 images]
[im 20/159  mediastinal]
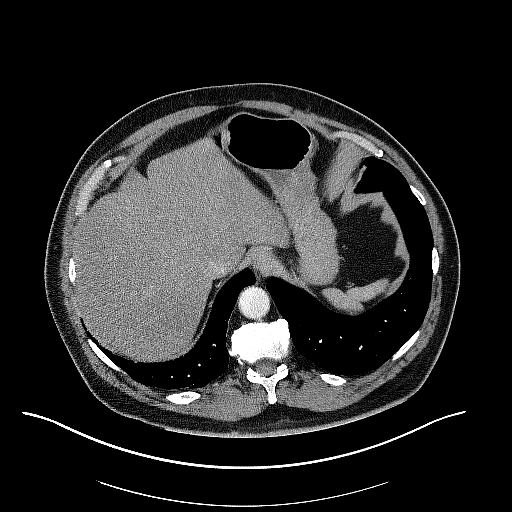
[im 20/159  lung]
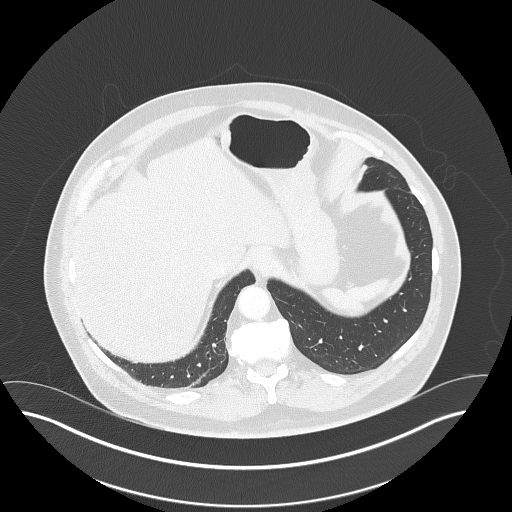
[im 40/159  lung]
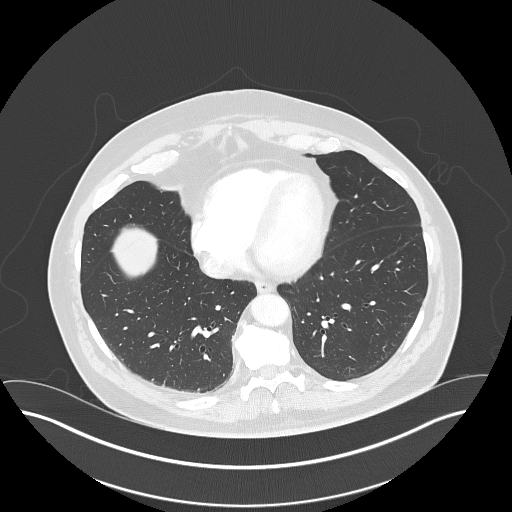
[im 60/159  lung]
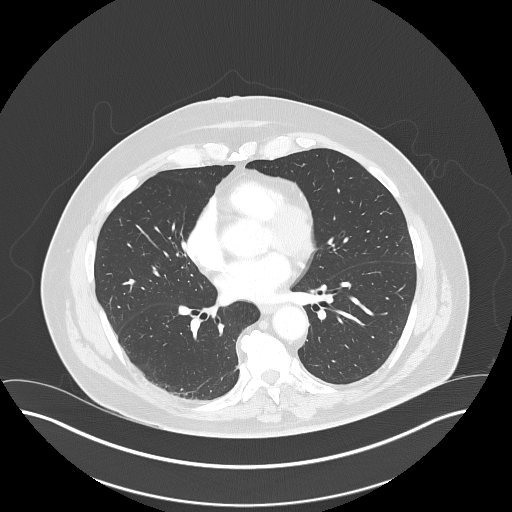
[im 80/159  lung]
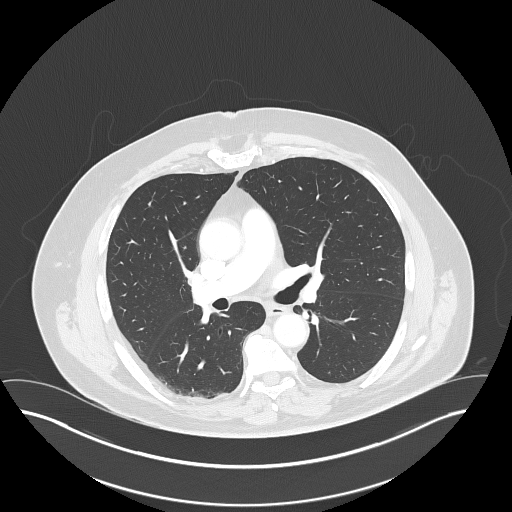
[im 99/159  mediastinal]
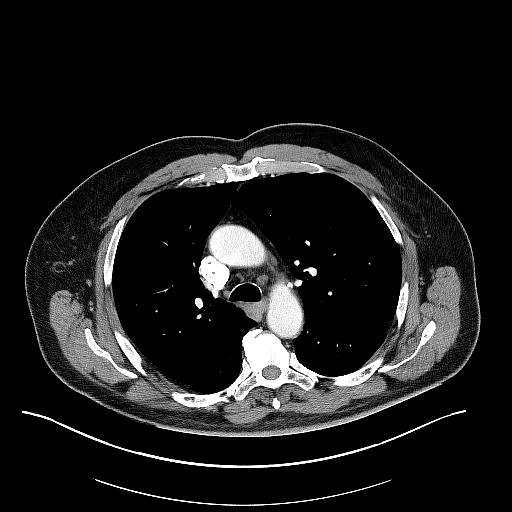
[im 99/159  lung]
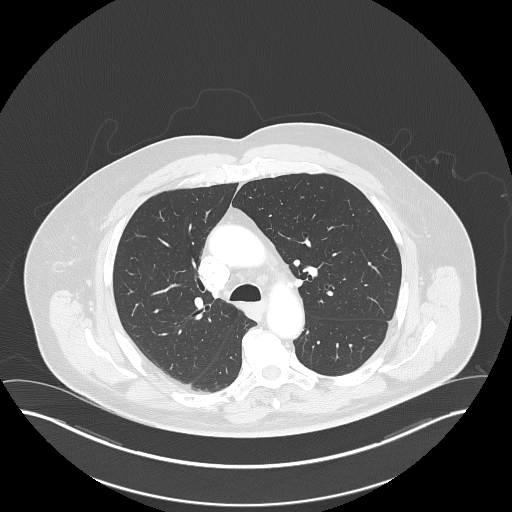
[im 119/159  lung]
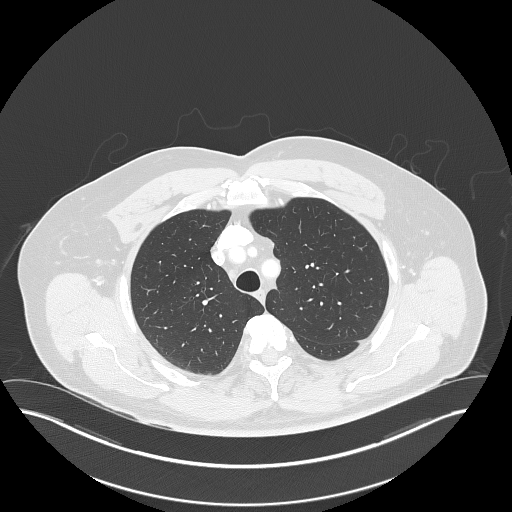
[im 139/159  lung]
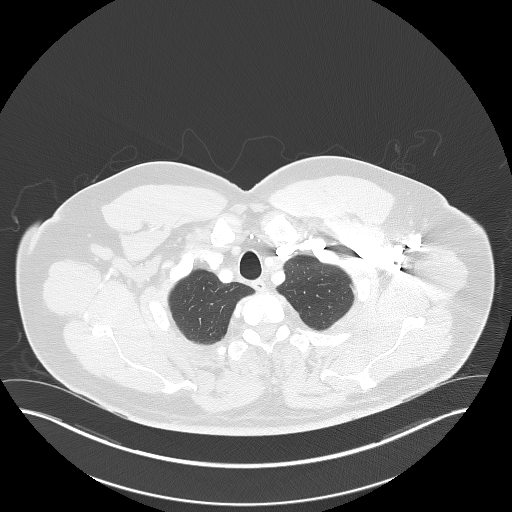

[Series 6: chest with 2.00 br40 s3 cor · coronal · 0.62mm/px · 3 of 230 slices shown]
[im 46/230  lung]
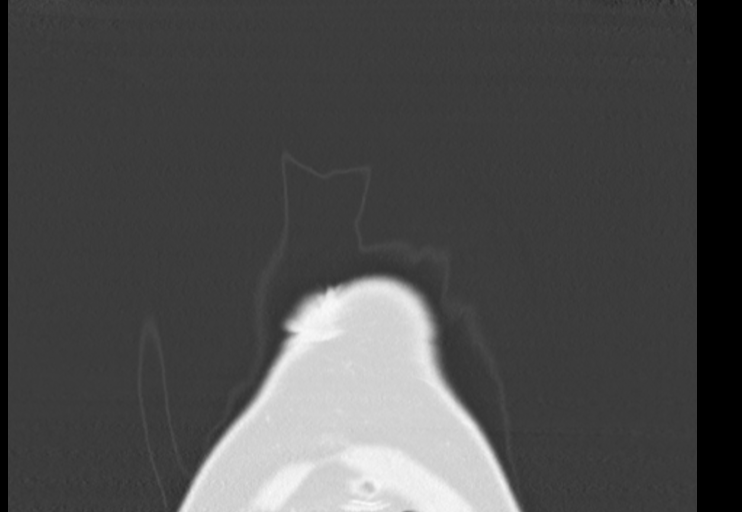
[im 92/230  lung]
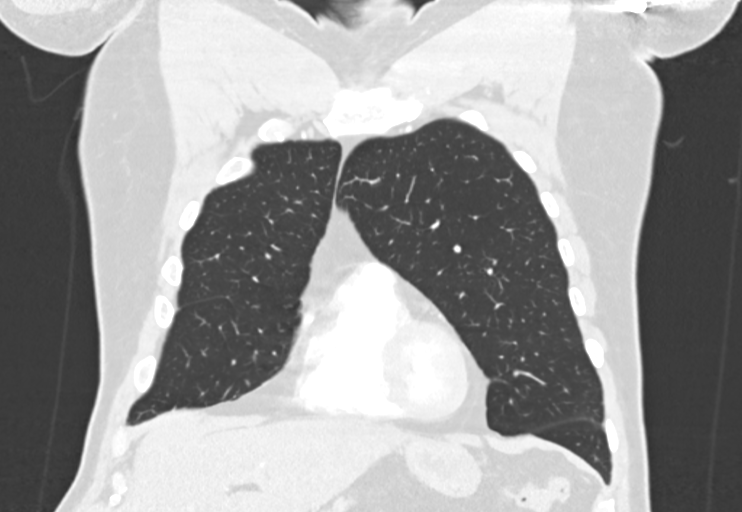
[im 138/230  lung]
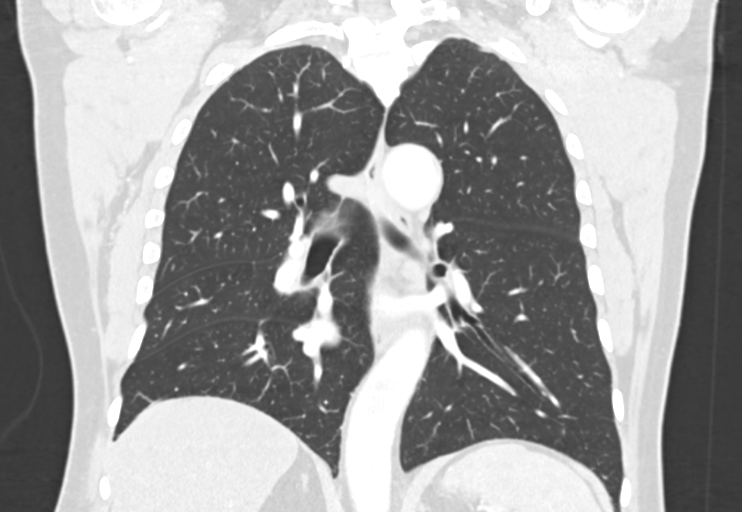

[Series 20: neck 2.00 br60 s3 axial · axial · 0.52mm/px · 1 of 127 slices shown]
[im 22/127  lung]
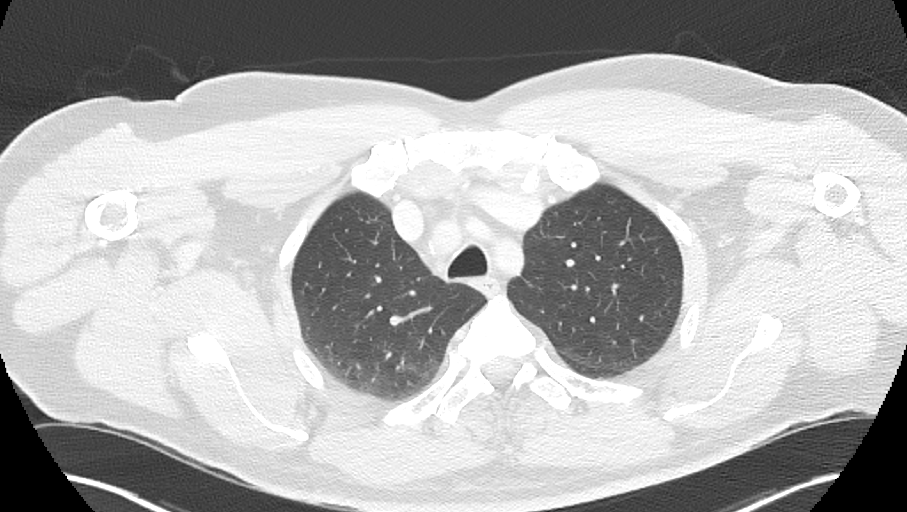

[14 of 36 positions shown; findings below may reference images not displayed]

Creatinine was obtained on site at [HOSPITAL] at [REDACTED].

Results: Creatinine 0.9 mg/dL.
FINDINGS: Cardiovascular: Atherosclerotic calcification of the aorta. Heart
size normal. No pericardial effusion.

Mediastinum/Nodes: No pathologically enlarged mediastinal, hilar or
axillary lymph nodes. Esophagus is unremarkable.

Lungs/Pleura: 3 mm peripheral left lower lobe nodule (4/131),
unchanged and benign. Lungs are otherwise clear. No pleural fluid.
Airway is unremarkable.

Upper Abdomen: Visualized portion of the liver is decreased in
attenuation diffusely. Visualized portions of the adrenal glands,
spleen, pancreas, stomach and bowel are grossly unremarkable. No
upper abdominal adenopathy.

Musculoskeletal: Degenerative changes in the spine. No worrisome
lytic or sclerotic lesions. Fat density mass overlying the right
scapula is incompletely imaged, measuring at least 3.1 x 9.9 cm,
similar to 04/12/2016. No solid mass.
IMPRESSION: 1. Partially imaged fat density mass overlying the right scapula is
indicative of a lipoma and appears grossly unchanged from
04/12/2016.
2. Hepatic steatosis.
3.  Aortic atherosclerosis (399X5-MF2.2).

## 2022-07-03 ENCOUNTER — Other Ambulatory Visit: Payer: Self-pay

## 2022-07-03 ENCOUNTER — Emergency Department (HOSPITAL_BASED_OUTPATIENT_CLINIC_OR_DEPARTMENT_OTHER): Payer: Medicare PPO

## 2022-07-03 ENCOUNTER — Encounter (HOSPITAL_BASED_OUTPATIENT_CLINIC_OR_DEPARTMENT_OTHER): Payer: Self-pay | Admitting: Urology

## 2022-07-03 ENCOUNTER — Emergency Department (HOSPITAL_BASED_OUTPATIENT_CLINIC_OR_DEPARTMENT_OTHER)
Admission: EM | Admit: 2022-07-03 | Discharge: 2022-07-03 | Disposition: A | Discharge status: Home / Self Care | Payer: Medicare PPO | Attending: Emergency Medicine | Admitting: Emergency Medicine

## 2022-07-03 DIAGNOSIS — R6883 Chills (without fever): Secondary | ICD-10-CM | POA: Diagnosis not present

## 2022-07-03 DIAGNOSIS — Z20822 Contact with and (suspected) exposure to covid-19: Secondary | ICD-10-CM | POA: Insufficient documentation

## 2022-07-03 DIAGNOSIS — R6889 Other general symptoms and signs: Secondary | ICD-10-CM

## 2022-07-03 DIAGNOSIS — R059 Cough, unspecified: Secondary | ICD-10-CM | POA: Diagnosis present

## 2022-07-03 LAB — RESP PANEL BY RT-PCR (RSV, FLU A&B, COVID)  RVPGX2
Influenza A by PCR: NEGATIVE
Influenza B by PCR: NEGATIVE
Resp Syncytial Virus by PCR: NEGATIVE
SARS Coronavirus 2 by RT PCR: NEGATIVE

## 2022-07-03 MED ORDER — AZITHROMYCIN 250 MG PO TABS: 250.0000 mg | ORAL_TABLET | Freq: Every day | ORAL | 0 refills | Status: AC

## 2022-07-03 NOTE — ED Triage Notes (Signed)
Pt states he thinks he has RSV  Pt states dry cough  Has chills, sneezing, swollen glands, and nausea x 1 week

## 2022-07-03 NOTE — ED Provider Notes (Signed)
[facility]ENC[facility] DEPARTMENT Provider Note   CSN: [id number] Arrival date & time: 07/03/22  1905     History  Chief Complaint  Patient presents with   Flu Like Symptoms     Brian Maxwell is a 76 y.o. male.  Patient with flulike illness for the last several days.  Concerned he might have RSV.  Nothing makes it worse or better.  No major medical problems.  Denies any cough or sputum production.  Has had chills and body aches.  Has had a cough.  No sputum production.  Felt little bit worse tonight but actually feels better now.  Denies any chest pain or shortness of breath or abdominal pain  The history is provided by the patient.       Home Medications Prior to Admission medications   Medication Sig Start Date End Date Taking? Authorizing Provider  azithromycin (ZITHROMAX) 250 MG tablet Take 1 tablet (250 mg total) by mouth daily. Take first 2 tablets together, then 1 every day until finished. 07/03/22  Yes Jaspreet Bodner, DO  Acetaminophen (ACETAMINOPHEN EXTRA STRENGTH) 500 MG capsule 1 capsule as needed    [provider]  allopurinol (ZYLOPRIM) 100 MG tablet Take 200 mg by mouth every morning.     [provider]  buPROPion (WELLBUTRIN XL) 300 MG 24 hr tablet Take 300 mg by mouth daily with breakfast.     [provider]  clonazePAM (KLONOPIN) 0.5 MG tablet Take 0.5 mg 2 (two) times daily as needed by mouth for anxiety (takes every nighttime.).     [provider]  Coenzyme Q10 (EQL COQ10) 300 MG CAPS Take 300 mg by mouth every morning.     [provider]  EPINEPHrine 0.3 mg/0.3 mL IJ SOAJ injection SMARTSIG:IM As Directed PRN 12/27/21   [provider]  Ginkgo Biloba Extract (GNP GINGKO BILOBA EXTRACT) 60 MG CAPS Take 120 mg by mouth every morning.     [provider]  Glucosamine-Chondroitin (RA GLUCOSAMINE-CHONDROITIN) 750-600 MG TABS Take 1 tablet by mouth every morning.     [provider]  hydrocortisone 2.5 % cream Apply 1 Application topically 4 (four) times daily. 12/21/21   [provider]  Magnesium Citrate 125 MG CAPS Take by mouth.    [provider]  meloxicam (MOBIC) 15 MG tablet Take 15 mg by mouth daily as needed. 01/12/22   [provider]  mirtazapine (REMERON) 15 MG tablet Take 7.5 mg by mouth at bedtime. 02/04/22   [provider]  Omega-3 Fatty Acids (FISH OIL) 1000 MG CAPS SMARTSIG:1 Capsule(s) By Mouth 12/21/21   [provider]  predniSONE (STERAPRED UNI-PAK 21 TAB) 10 MG (21) TBPK tablet Take 10 mg by mouth as directed. 03/20/22   [provider]  TOBRADEX ST 0.3-0.05 % SUSP Apply 1 drop to eye 3 (three) times daily. Patient not taking: Reported on 04/12/2022 02/20/22   [provider]  travoprost, benzalkonium, (TRAVATAN) 0.004 % ophthalmic solution Place 1 drop into both eyes at bedtime. Patient not taking: Reported on 04/12/2022    [provider]  valsartan (DIOVAN) 40 MG tablet Take by mouth. 12/21/21   [provider]      Allergies    Bactrim [sulfamethoxazole-trimethoprim], Bee venom, and Doxycycline    Review of Systems   Review of Systems  Physical Exam Updated Vital Signs BP 120/79 (BP Location: Right Arm)   Pulse 77   Temp 98 F (36.7 C) (Oral)  Resp (!) 22   Ht '5\' 10"'$  (1.778 m)   Wt 98.4 kg   SpO2 96%   BMI 31.14 kg/m  Physical Exam Vitals and nursing note reviewed.  Constitutional:      General: He is not in acute distress.    Appearance: He is well-developed. He is not ill-appearing.  HENT:     Head: Normocephalic and atraumatic.     Nose: Nose normal.     Mouth/Throat:     Mouth: Mucous membranes are moist.  Eyes:     Extraocular Movements: Extraocular movements intact.     Conjunctiva/sclera: Conjunctivae normal.     Pupils: Pupils are equal, round, and reactive to light.  Cardiovascular:     Rate and Rhythm: Normal rate and regular  rhythm.     Pulses: Normal pulses.     Heart sounds: Normal heart sounds. No murmur heard. Pulmonary:     Effort: Pulmonary effort is normal. No respiratory distress.     Breath sounds: Normal breath sounds.  Abdominal:     Palpations: Abdomen is soft.     Tenderness: There is no abdominal tenderness.  Musculoskeletal:        General: No swelling.     Cervical back: Normal range of motion and neck supple.  Skin:    General: Skin is warm and dry.     Capillary Refill: Capillary refill takes less than 2 seconds.  Neurological:     General: No focal deficit present.     Mental Status: He is alert.  Psychiatric:        Mood and Affect: Mood normal.     ED Results / Procedures / Treatments   Labs (all labs ordered are listed, but only abnormal results are displayed) Labs Reviewed  RESP PANEL BY RT-PCR (RSV, FLU A&B, COVID)  [id number]    EKG None  Radiology DG Chest 2 View  Result Date: 07/03/2022 CLINICAL DATA:  Cough EXAM: CHEST - 2 VIEW COMPARISON:  Radiographs 02/26/2021 FINDINGS: Stable cardiomediastinal silhouette. Aortic atherosclerotic calcification. No focal consolidation, pleural effusion, or pneumothorax. No acute osseous abnormality. IMPRESSION: No active cardiopulmonary disease. Electronically Signed   By: Placido Sou M.D.   On: 07/03/2022 20:40    Procedures Procedures    Medications Ordered in ED Medications - No data to display  ED Course/ Medical Decision Making/ A&P                           Medical Decision Making Amount and/or Complexity of Data Reviewed Radiology: ordered.  Risk Prescription drug management.   Brian Maxwell is here with viral type illness.  No significant comorbidities.  Has had cough and body aches.  Chest x-ray and RSV, flu, COVID testing done.  Per my review and interpretation labs he is negative for those viruses.  Chest x-ray shows no evidence of pneumonia or pneumothorax.  He has had symptoms for the last few  days.  His vital signs are normal here.  No fever here.  Shared decision was made to treat with Z-Pak.  Overall suspect viral process.  Discharged in good condition.  Understands return precautions.  This chart was dictated using voice recognition software.  Despite best efforts to proofread,  errors can occur which can change the documentation meaning.         Final Clinical Impression(s) / ED Diagnoses Final diagnoses:  Flu-like symptoms    Rx / DC Orders ED Discharge Orders  Ordered    azithromycin (ZITHROMAX) 250 MG tablet  Daily        07/03/22 2146              Lennice Sites, DO 07/03/22 2148

## 2022-08-09 NOTE — Progress Notes (Deleted)
Follow Up Note  RE: ADETOKUNBO MCCADDEN MRN: [id number] DOB: [birth year] Date of Office Visit: 08/10/2022  Referring provider: Aretta Nip, MD Primary care provider: Larey Dresser Girtha Rm, FNP  Chief Complaint: No chief complaint on file.  History of Present Illness: I had the pleasure of seeing Jarquis Walker for a follow up visit at the [facility] on 08/09/2022. He is a 77 y.o. male, who is being followed for hymenoptera allergy. His previous allergy office visit was on 02/21/2022 with Althea Charon FNP. Today is a regular follow up visit.  Skin testing to hymenoptera was negative today. 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   Stinging insects Skin testing on 02/02/22 was negative fire ant.  Skin testing today was if to honeybee, yellow hornet, white hornet, and wasp Recommend venom immunotherapy to help prevent further anaphylactic reactions.  Informed consent signed.  CPT code given to call insurance to find out cost.  When she find out cost and if you are interested call our office to start injections. Continue avoidance and carry epinephrine with you at all times especially when outdoors.  Recommend getting a medical alert bracelet for this.  Demonstrated proper use of epinephrine at last office visit.  For mild symptoms you can take over the counter antihistamines such as zyrtec '10mg'$  every 12 hours and monitor symptoms closely. If symptoms worsen or if you have severe symptoms including breathing issues, throat closure, significant swelling, whole body hives, severe diarrhea and vomiting, lightheadedness then inject epinephrine and seek immediate medical care afterwards. Emergency action plan given at last office visit Get tryptase level as ordered by Dr. Maudie Mercury.  We call you with results once they are back.     Keep already scheduled  follow-up appointment on August 10, 2022 at 4 PM Dr. Maudie Mercury  Assessment and Plan: Bharat is a 77 y.o. male with: No problem-specific Assessment & Plan notes found for this encounter.  No follow-ups on file.  No orders of the defined types were placed in this encounter.  Lab Orders  No laboratory test(s) ordered today    Diagnostics: Spirometry:  Tracings reviewed. His effort: {Blank single:19197::"Good reproducible efforts.","It was hard to get consistent efforts and there is a question as to whether this reflects a maximal maneuver.","Poor effort, data can not be interpreted."} FVC: ***L FEV1: ***L, ***% predicted FEV1/FVC ratio: ***% Interpretation: {Blank single:19197::"Spirometry consistent with mild obstructive disease","Spirometry consistent with moderate obstructive disease","Spirometry consistent with severe obstructive disease","Spirometry consistent with possible restrictive disease","Spirometry consistent with mixed obstructive and restrictive disease","Spirometry uninterpretable due to technique","Spirometry consistent with normal pattern","No overt abnormalities noted given today's efforts"}.  Please see scanned spirometry results for details.  Skin Testing: {Blank single:19197::"Select foods","Environmental allergy panel","Environmental allergy panel and select foods","Food allergy panel","None","Deferred due to recent antihistamines use"}. *** Results discussed with patient/family.   Medication List:  Current Outpatient Medications  Medication Sig Dispense Refill   Acetaminophen (ACETAMINOPHEN EXTRA STRENGTH) 500 MG capsule 1 capsule as needed     allopurinol (ZYLOPRIM) 100 MG tablet Take 200 mg by mouth every morning.      azithromycin (ZITHROMAX) 250 MG tablet Take 1 tablet (250 mg total) by mouth daily. Take first 2 tablets together, then 1 every day until finished. 6 tablet 0   buPROPion (WELLBUTRIN XL) 300 MG 24 hr tablet Take 300 mg by mouth daily with breakfast.  clonazePAM (KLONOPIN) 0.5 MG tablet Take 0.5 mg 2 (two) times daily as needed by mouth for anxiety (takes every nighttime.).      Coenzyme Q10 (EQL COQ10) 300 MG CAPS Take 300 mg by mouth every morning.      EPINEPHrine 0.3 mg/0.3 mL IJ SOAJ injection SMARTSIG:IM As Directed PRN     Ginkgo Biloba Extract (GNP GINGKO BILOBA EXTRACT) 60 MG CAPS Take 120 mg by mouth every morning.      Glucosamine-Chondroitin (RA GLUCOSAMINE-CHONDROITIN) 750-600 MG TABS Take 1 tablet by mouth every morning.      hydrocortisone 2.5 % cream Apply 1 Application topically 4 (four) times daily.     Magnesium Citrate 125 MG CAPS Take by mouth.     meloxicam (MOBIC) 15 MG tablet Take 15 mg by mouth daily as needed.     mirtazapine (REMERON) 15 MG tablet Take 7.5 mg by mouth at bedtime.     Omega-3 Fatty Acids (FISH OIL) 1000 MG CAPS SMARTSIG:1 Capsule(s) By Mouth     predniSONE (STERAPRED UNI-PAK 21 TAB) 10 MG (21) TBPK tablet Take 10 mg by mouth as directed.     TOBRADEX ST 0.3-0.05 % SUSP Apply 1 drop to eye 3 (three) times daily. (Patient not taking: Reported on 04/12/2022)     travoprost, benzalkonium, (TRAVATAN) 0.004 % ophthalmic solution Place 1 drop into both eyes at bedtime. (Patient not taking: Reported on 04/12/2022)     valsartan (DIOVAN) 40 MG tablet Take by mouth.     No current facility-administered medications for this visit.   Allergies: Allergies  Allergen Reactions   Bactrim [Sulfamethoxazole-Trimethoprim] Shortness Of Breath, Diarrhea, Nausea Only and Other (See Comments)    Experienced fever   Bee Venom Anaphylaxis   Doxycycline Other (See Comments)    Dizziness and inflammation of right hip joint   I reviewed his past medical history, social history, family history, and environmental history and no significant changes have been reported from his previous visit.  Review of Systems  Constitutional:  Negative for appetite change, chills, fever and unexpected weight change.  HENT:  Negative  for congestion and rhinorrhea.   Eyes:  Negative for itching.  Respiratory:  Negative for cough, chest tightness, shortness of breath and wheezing.   Cardiovascular:  Negative for chest pain.  Gastrointestinal:  Negative for abdominal pain.  Genitourinary:  Negative for difficulty urinating.  Skin:  Negative for rash.  Neurological:  Negative for headaches.    Objective: There were no vitals taken for this visit. There is no height or weight on file to calculate BMI. Physical Exam Vitals and nursing note reviewed.  Constitutional:      Appearance: Normal appearance. He is well-developed.  HENT:     Head: Normocephalic and atraumatic.     Right Ear: Tympanic membrane and external ear normal.     Left Ear: Tympanic membrane and external ear normal.     Nose: Nose normal.     Mouth/Throat:     Mouth: Mucous membranes are moist.     Pharynx: Oropharynx is clear.  Eyes:     Conjunctiva/sclera: Conjunctivae normal.  Cardiovascular:     Rate and Rhythm: Normal rate and regular rhythm.     Heart sounds: Normal heart sounds. No murmur heard.    No friction rub. No gallop.  Pulmonary:     Effort: Pulmonary effort is normal.     Breath sounds: Normal breath sounds. No wheezing, rhonchi or rales.  Musculoskeletal:     Cervical  back: Neck supple.  Skin:    General: Skin is warm.     Findings: No rash.  Neurological:     Mental Status: He is alert and oriented to person, place, and time.  Psychiatric:        Behavior: Behavior normal.    Previous notes and tests were reviewed. The plan was reviewed with the patient/family, and all questions/concerned were addressed.  It was my pleasure to see Basir today and participate in his care. Please feel free to contact me with any questions or concerns.  Sincerely,  Rexene Alberts, DO Allergy & Immunology  [facility] office: [place] office: [phone]

## 2022-08-10 ENCOUNTER — Ambulatory Visit: Payer: Medicare PPO | Admitting: Allergy

## 2023-04-10 DIAGNOSIS — F4322 Adjustment disorder with anxiety: Secondary | ICD-10-CM | POA: Diagnosis not present

## 2023-04-17 DIAGNOSIS — L821 Other seborrheic keratosis: Secondary | ICD-10-CM | POA: Diagnosis not present

## 2023-04-17 DIAGNOSIS — Z85828 Personal history of other malignant neoplasm of skin: Secondary | ICD-10-CM | POA: Diagnosis not present

## 2023-04-17 DIAGNOSIS — L578 Other skin changes due to chronic exposure to nonionizing radiation: Secondary | ICD-10-CM | POA: Diagnosis not present

## 2023-04-17 DIAGNOSIS — D225 Melanocytic nevi of trunk: Secondary | ICD-10-CM | POA: Diagnosis not present

## 2023-04-17 DIAGNOSIS — L814 Other melanin hyperpigmentation: Secondary | ICD-10-CM | POA: Diagnosis not present

## 2023-05-11 DIAGNOSIS — L719 Rosacea, unspecified: Secondary | ICD-10-CM | POA: Diagnosis not present

## 2023-05-11 DIAGNOSIS — H401131 Primary open-angle glaucoma, bilateral, mild stage: Secondary | ICD-10-CM | POA: Diagnosis not present

## 2023-06-04 DIAGNOSIS — H401131 Primary open-angle glaucoma, bilateral, mild stage: Secondary | ICD-10-CM | POA: Diagnosis not present

## 2023-06-27 DIAGNOSIS — M1611 Unilateral primary osteoarthritis, right hip: Secondary | ICD-10-CM | POA: Diagnosis not present

## 2023-06-27 DIAGNOSIS — Z125 Encounter for screening for malignant neoplasm of prostate: Secondary | ICD-10-CM | POA: Diagnosis not present

## 2023-06-27 DIAGNOSIS — I700 Atherosclerosis of aorta: Secondary | ICD-10-CM | POA: Diagnosis not present

## 2023-06-27 DIAGNOSIS — E785 Hyperlipidemia, unspecified: Secondary | ICD-10-CM | POA: Diagnosis not present

## 2023-06-27 DIAGNOSIS — Z23 Encounter for immunization: Secondary | ICD-10-CM | POA: Diagnosis not present

## 2023-06-27 DIAGNOSIS — Z0000 Encounter for general adult medical examination without abnormal findings: Secondary | ICD-10-CM | POA: Diagnosis not present

## 2023-06-27 DIAGNOSIS — Z1211 Encounter for screening for malignant neoplasm of colon: Secondary | ICD-10-CM | POA: Diagnosis not present

## 2023-06-27 DIAGNOSIS — I10 Essential (primary) hypertension: Secondary | ICD-10-CM | POA: Diagnosis not present

## 2023-07-19 DIAGNOSIS — Z85828 Personal history of other malignant neoplasm of skin: Secondary | ICD-10-CM | POA: Diagnosis not present

## 2023-07-19 DIAGNOSIS — L814 Other melanin hyperpigmentation: Secondary | ICD-10-CM | POA: Diagnosis not present

## 2023-07-19 DIAGNOSIS — D225 Melanocytic nevi of trunk: Secondary | ICD-10-CM | POA: Diagnosis not present

## 2023-07-19 DIAGNOSIS — C4362 Malignant melanoma of left upper limb, including shoulder: Secondary | ICD-10-CM | POA: Diagnosis not present

## 2023-07-19 DIAGNOSIS — L821 Other seborrheic keratosis: Secondary | ICD-10-CM | POA: Diagnosis not present

## 2023-08-13 DIAGNOSIS — D0362 Melanoma in situ of left upper limb, including shoulder: Secondary | ICD-10-CM | POA: Diagnosis not present

## 2023-08-13 DIAGNOSIS — Z85820 Personal history of malignant melanoma of skin: Secondary | ICD-10-CM | POA: Diagnosis not present

## 2023-08-13 DIAGNOSIS — C4362 Malignant melanoma of left upper limb, including shoulder: Secondary | ICD-10-CM | POA: Diagnosis not present

## 2023-08-13 DIAGNOSIS — Z85828 Personal history of other malignant neoplasm of skin: Secondary | ICD-10-CM | POA: Diagnosis not present

## 2023-09-06 DIAGNOSIS — F4322 Adjustment disorder with anxiety: Secondary | ICD-10-CM | POA: Diagnosis not present

## 2023-09-11 DIAGNOSIS — M6740 Ganglion, unspecified site: Secondary | ICD-10-CM | POA: Diagnosis not present

## 2023-09-11 DIAGNOSIS — Z85820 Personal history of malignant melanoma of skin: Secondary | ICD-10-CM | POA: Diagnosis not present

## 2023-09-11 DIAGNOSIS — Z85828 Personal history of other malignant neoplasm of skin: Secondary | ICD-10-CM | POA: Diagnosis not present

## 2023-09-11 DIAGNOSIS — M67442 Ganglion, left hand: Secondary | ICD-10-CM | POA: Diagnosis not present

## 2023-09-14 DIAGNOSIS — H5212 Myopia, left eye: Secondary | ICD-10-CM | POA: Diagnosis not present

## 2023-09-14 DIAGNOSIS — H524 Presbyopia: Secondary | ICD-10-CM | POA: Diagnosis not present

## 2023-09-14 DIAGNOSIS — H52223 Regular astigmatism, bilateral: Secondary | ICD-10-CM | POA: Diagnosis not present

## 2023-10-11 DIAGNOSIS — Z23 Encounter for immunization: Secondary | ICD-10-CM | POA: Diagnosis not present

## 2023-11-01 DIAGNOSIS — M67442 Ganglion, left hand: Secondary | ICD-10-CM | POA: Diagnosis not present

## 2023-11-01 DIAGNOSIS — D1801 Hemangioma of skin and subcutaneous tissue: Secondary | ICD-10-CM | POA: Diagnosis not present

## 2023-11-01 DIAGNOSIS — Z85828 Personal history of other malignant neoplasm of skin: Secondary | ICD-10-CM | POA: Diagnosis not present

## 2023-11-01 DIAGNOSIS — L570 Actinic keratosis: Secondary | ICD-10-CM | POA: Diagnosis not present

## 2023-11-01 DIAGNOSIS — D225 Melanocytic nevi of trunk: Secondary | ICD-10-CM | POA: Diagnosis not present

## 2023-11-01 DIAGNOSIS — Z85820 Personal history of malignant melanoma of skin: Secondary | ICD-10-CM | POA: Diagnosis not present

## 2023-11-01 DIAGNOSIS — B078 Other viral warts: Secondary | ICD-10-CM | POA: Diagnosis not present

## 2023-11-01 DIAGNOSIS — L814 Other melanin hyperpigmentation: Secondary | ICD-10-CM | POA: Diagnosis not present

## 2023-11-03 DIAGNOSIS — R413 Other amnesia: Secondary | ICD-10-CM | POA: Diagnosis not present

## 2023-11-03 DIAGNOSIS — N399 Disorder of urinary system, unspecified: Secondary | ICD-10-CM | POA: Diagnosis not present

## 2023-11-03 DIAGNOSIS — J309 Allergic rhinitis, unspecified: Secondary | ICD-10-CM | POA: Diagnosis not present

## 2023-11-05 DIAGNOSIS — R350 Frequency of micturition: Secondary | ICD-10-CM | POA: Diagnosis not present

## 2023-11-05 DIAGNOSIS — F4322 Adjustment disorder with anxiety: Secondary | ICD-10-CM | POA: Diagnosis not present

## 2023-11-05 DIAGNOSIS — J019 Acute sinusitis, unspecified: Secondary | ICD-10-CM | POA: Diagnosis not present

## 2023-11-29 DIAGNOSIS — F411 Generalized anxiety disorder: Secondary | ICD-10-CM | POA: Diagnosis not present

## 2023-12-03 DIAGNOSIS — H401131 Primary open-angle glaucoma, bilateral, mild stage: Secondary | ICD-10-CM | POA: Diagnosis not present

## 2023-12-03 DIAGNOSIS — I10 Essential (primary) hypertension: Secondary | ICD-10-CM | POA: Diagnosis not present

## 2023-12-03 DIAGNOSIS — H35372 Puckering of macula, left eye: Secondary | ICD-10-CM | POA: Diagnosis not present

## 2023-12-03 DIAGNOSIS — L719 Rosacea, unspecified: Secondary | ICD-10-CM | POA: Diagnosis not present

## 2023-12-12 ENCOUNTER — Emergency Department (HOSPITAL_BASED_OUTPATIENT_CLINIC_OR_DEPARTMENT_OTHER)

## 2023-12-12 ENCOUNTER — Encounter (HOSPITAL_BASED_OUTPATIENT_CLINIC_OR_DEPARTMENT_OTHER): Payer: Self-pay | Admitting: Emergency Medicine

## 2023-12-12 ENCOUNTER — Emergency Department (HOSPITAL_BASED_OUTPATIENT_CLINIC_OR_DEPARTMENT_OTHER)
Admission: EM | Admit: 2023-12-12 | Discharge: 2023-12-12 | Disposition: A | Discharge status: Home / Self Care | Attending: Emergency Medicine | Admitting: Emergency Medicine

## 2023-12-12 ENCOUNTER — Other Ambulatory Visit: Payer: Self-pay

## 2023-12-12 DIAGNOSIS — R051 Acute cough: Secondary | ICD-10-CM | POA: Insufficient documentation

## 2023-12-12 DIAGNOSIS — Z8701 Personal history of pneumonia (recurrent): Secondary | ICD-10-CM | POA: Diagnosis not present

## 2023-12-12 DIAGNOSIS — I700 Atherosclerosis of aorta: Secondary | ICD-10-CM | POA: Diagnosis not present

## 2023-12-12 DIAGNOSIS — R059 Cough, unspecified: Secondary | ICD-10-CM | POA: Diagnosis not present

## 2023-12-12 NOTE — ED Triage Notes (Addendum)
 Patient reports non productive cough and "not being able to clear lungs" x1 day. Denies fevers. Denies shortness of breath.

## 2023-12-12 NOTE — ED Provider Notes (Signed)
 [facility] AT [facility]  Provider Note  CSN: [id number] Arrival date & time: 12/12/23 2952  History Chief Complaint  Patient presents with   Cough    Brian Maxwell is a 78 y.o. male here for evaluation of nonproductive cough x 12 hours, not associated with fever, SOB or CP. He states feels similar to previously diagnosed pneumonia.    Home Medications Prior to Admission medications   Medication Sig Start Date End Date Taking? Authorizing Provider  Acetaminophen  (ACETAMINOPHEN  EXTRA STRENGTH) 500 MG capsule 1 capsule as needed    [provider]  allopurinol  (ZYLOPRIM ) 100 MG tablet Take 200 mg by mouth every morning.     [provider]  azithromycin  (ZITHROMAX ) 250 MG tablet Take 1 tablet (250 mg total) by mouth daily. Take first 2 tablets together, then 1 every day until finished. 07/03/22   Curatolo, Adam, DO  buPROPion  (WELLBUTRIN  XL) 300 MG 24 hr tablet Take 300 mg by mouth daily with breakfast.     [provider]  clonazePAM  (KLONOPIN ) 0.5 MG tablet Take 0.5 mg 2 (two) times daily as needed by mouth for anxiety (takes every nighttime.).     [provider]  Coenzyme Q10 (EQL COQ10) 300 MG CAPS Take 300 mg by mouth every morning.     [provider]  EPINEPHrine  0.3 mg/0.3 mL IJ SOAJ injection SMARTSIG:IM As Directed PRN 12/27/21   [provider]  Ginkgo Biloba Extract (GNP GINGKO BILOBA EXTRACT) 60 MG CAPS Take 120 mg by mouth every morning.     [provider]  Glucosamine-Chondroitin (RA GLUCOSAMINE-CHONDROITIN) 750-600 MG TABS Take 1 tablet by mouth every morning.     [provider]  hydrocortisone 2.5 % cream Apply 1 Application topically 4 (four) times daily. 12/21/21   [provider]  Magnesium Citrate 125 MG CAPS Take by mouth.    [provider]  meloxicam (MOBIC) 15 MG tablet Take 15 mg by mouth daily as needed. 01/12/22   [provider]  mirtazapine  (REMERON ) 15 MG tablet Take 7.5 mg by mouth at bedtime. 02/04/22   [provider]  Omega-3 Fatty Acids (FISH OIL) 1000 MG CAPS SMARTSIG:1 Capsule(s) By Mouth 12/21/21   [provider]  predniSONE  (STERAPRED UNI-PAK 21 TAB) 10 MG (21) TBPK tablet Take 10 mg by mouth as directed. 03/20/22   [provider]  TOBRADEX ST 0.3-0.05 % SUSP Apply 1 drop to eye 3 (three) times daily. Patient not taking: Reported on 04/12/2022 02/20/22   [provider]  travoprost, benzalkonium, (TRAVATAN) 0.004 % ophthalmic solution Place 1 drop into both eyes at bedtime. Patient not taking: Reported on 04/12/2022    [provider]  valsartan (DIOVAN) 40 MG tablet Take by mouth. 12/21/21   [provider]     Allergies    Bactrim [sulfamethoxazole-trimethoprim], Bee venom, and Doxycycline   Review of Systems   Review of Systems Please see HPI for pertinent positives and negatives  Physical Exam BP 120/75   Pulse 75   Temp 97.8 F (36.6 C) (Oral)   Resp 20   Ht 5\' 10"  (1.778 m)   Wt 97.5 kg   SpO2 97%   BMI 30.85 kg/m   Physical Exam Vitals and nursing note reviewed.  Constitutional:      Appearance: Normal appearance.  HENT:     Head: Normocephalic and atraumatic.     Nose: Nose normal.     Mouth/Throat:  Mouth: Mucous membranes are moist.  Eyes:     Extraocular Movements: Extraocular movements intact.     Conjunctiva/sclera: Conjunctivae normal.  Cardiovascular:     Rate and Rhythm: Normal rate.  Pulmonary:     Effort: Pulmonary effort is normal.     Breath sounds: Normal breath sounds. No wheezing, rhonchi or rales.  Abdominal:     General: Abdomen is flat.     Palpations: Abdomen is soft.     Tenderness: There is no abdominal tenderness.  Musculoskeletal:        General: No swelling. Normal range of motion.     Cervical back: Neck supple.  Skin:    General: Skin is warm and dry.  Neurological:      General: No focal deficit present.     Mental Status: He is alert.  Psychiatric:        Mood and Affect: Mood normal.     ED Results / Procedures / Treatments   EKG None  Procedures Procedures  Medications Ordered in the ED Medications - No data to display  Initial Impression and Plan  Patient here with cough x 12 hours, exam and vitals are reassuring. Will check CXR given history of pneumonia.   ED Course   Clinical Course as of 12/12/23 0403  Wed Dec 12, 2023  0401 I personally viewed the images from radiology studies and agree with radiologist interpretation: CXR is clear. Discussed this with the patient, suspect viral vs allergic etiology. Low suspicion for bacterial process and antibiotics not indicated at this time. Recommend OTC cough remedies, PCP follow up, RTED for any other concerns.  [CS]    Clinical Course User Index [CS] Charmayne Cooper, MD     MDM Rules/Calculators/A&P Medical Decision Making Problems Addressed: Acute cough: acute illness or injury  Amount and/or Complexity of Data Reviewed Radiology: ordered and independent interpretation performed. Decision-making details documented in ED Course.  Risk OTC drugs.     Final Clinical Impression(s) / ED Diagnoses Final diagnoses:  Acute cough    Rx / DC Orders ED Discharge Orders     None        Charmayne Cooper, MD 12/12/23 [phone]

## 2023-12-24 DIAGNOSIS — Z6830 Body mass index (BMI) 30.0-30.9, adult: Secondary | ICD-10-CM | POA: Diagnosis not present

## 2023-12-24 DIAGNOSIS — R42 Dizziness and giddiness: Secondary | ICD-10-CM | POA: Diagnosis not present

## 2024-01-03 DIAGNOSIS — F4322 Adjustment disorder with anxiety: Secondary | ICD-10-CM | POA: Diagnosis not present

## 2024-02-04 ENCOUNTER — Telehealth: Payer: Self-pay | Admitting: Pharmacist

## 2024-02-04 DIAGNOSIS — Z85828 Personal history of other malignant neoplasm of skin: Secondary | ICD-10-CM | POA: Diagnosis not present

## 2024-02-04 DIAGNOSIS — L821 Other seborrheic keratosis: Secondary | ICD-10-CM | POA: Diagnosis not present

## 2024-02-04 DIAGNOSIS — L814 Other melanin hyperpigmentation: Secondary | ICD-10-CM | POA: Diagnosis not present

## 2024-02-04 DIAGNOSIS — Z85820 Personal history of malignant melanoma of skin: Secondary | ICD-10-CM | POA: Diagnosis not present

## 2024-02-04 DIAGNOSIS — L910 Hypertrophic scar: Secondary | ICD-10-CM | POA: Diagnosis not present

## 2024-02-04 DIAGNOSIS — L570 Actinic keratosis: Secondary | ICD-10-CM | POA: Diagnosis not present

## 2024-02-04 DIAGNOSIS — D225 Melanocytic nevi of trunk: Secondary | ICD-10-CM | POA: Diagnosis not present

## 2024-02-04 NOTE — Progress Notes (Addendum)
   02/04/2024  Patient ID: Mabel JAYSON Slade, male   DOB: 04-15-1946, 78 y.o.   MRN: [id number]  Patient appeared on insurance report for at-risk for failing 2025 metric: Medication Adherence for Hypertension [facility])    Medication: Valsartan 40mg  Last fill date: 09/04/23 90DS  Recent fills: 2/6 + 4/30 90DS  Fail date: 02/08/24   Aloysius Lewis, PharmD [place]  Phone Number: [phone]

## 2024-02-14 DIAGNOSIS — M25551 Pain in right hip: Secondary | ICD-10-CM | POA: Diagnosis not present

## 2024-03-13 DIAGNOSIS — F4322 Adjustment disorder with anxiety: Secondary | ICD-10-CM | POA: Diagnosis not present

## 2024-03-14 DIAGNOSIS — H26493 Other secondary cataract, bilateral: Secondary | ICD-10-CM | POA: Diagnosis not present

## 2024-03-14 DIAGNOSIS — H40003 Preglaucoma, unspecified, bilateral: Secondary | ICD-10-CM | POA: Diagnosis not present

## 2024-05-12 DIAGNOSIS — D225 Melanocytic nevi of trunk: Secondary | ICD-10-CM | POA: Diagnosis not present

## 2024-05-12 DIAGNOSIS — L821 Other seborrheic keratosis: Secondary | ICD-10-CM | POA: Diagnosis not present

## 2024-05-12 DIAGNOSIS — D171 Benign lipomatous neoplasm of skin and subcutaneous tissue of trunk: Secondary | ICD-10-CM | POA: Diagnosis not present

## 2024-05-12 DIAGNOSIS — Z85820 Personal history of malignant melanoma of skin: Secondary | ICD-10-CM | POA: Diagnosis not present

## 2024-05-12 DIAGNOSIS — L814 Other melanin hyperpigmentation: Secondary | ICD-10-CM | POA: Diagnosis not present

## 2024-05-12 DIAGNOSIS — L570 Actinic keratosis: Secondary | ICD-10-CM | POA: Diagnosis not present

## 2024-05-12 DIAGNOSIS — Z85828 Personal history of other malignant neoplasm of skin: Secondary | ICD-10-CM | POA: Diagnosis not present

## 2024-05-12 DIAGNOSIS — L910 Hypertrophic scar: Secondary | ICD-10-CM | POA: Diagnosis not present

## 2024-06-11 DIAGNOSIS — E785 Hyperlipidemia, unspecified: Secondary | ICD-10-CM | POA: Diagnosis not present

## 2024-06-11 DIAGNOSIS — N400 Enlarged prostate without lower urinary tract symptoms: Secondary | ICD-10-CM | POA: Diagnosis not present

## 2024-06-11 DIAGNOSIS — F4322 Adjustment disorder with anxiety: Secondary | ICD-10-CM | POA: Diagnosis not present

## 2024-06-11 DIAGNOSIS — M1990 Unspecified osteoarthritis, unspecified site: Secondary | ICD-10-CM | POA: Diagnosis not present

## 2024-06-11 DIAGNOSIS — E039 Hypothyroidism, unspecified: Secondary | ICD-10-CM | POA: Diagnosis not present

## 2024-06-11 DIAGNOSIS — F411 Generalized anxiety disorder: Secondary | ICD-10-CM | POA: Diagnosis not present

## 2024-06-11 DIAGNOSIS — F325 Major depressive disorder, single episode, in full remission: Secondary | ICD-10-CM | POA: Diagnosis not present

## 2024-06-11 DIAGNOSIS — I10 Essential (primary) hypertension: Secondary | ICD-10-CM | POA: Diagnosis not present

## 2024-06-11 DIAGNOSIS — R32 Unspecified urinary incontinence: Secondary | ICD-10-CM | POA: Diagnosis not present

## 2024-06-11 DIAGNOSIS — I700 Atherosclerosis of aorta: Secondary | ICD-10-CM | POA: Diagnosis not present

## 2024-06-12 DIAGNOSIS — I10 Essential (primary) hypertension: Secondary | ICD-10-CM | POA: Diagnosis not present

## 2024-06-12 DIAGNOSIS — F411 Generalized anxiety disorder: Secondary | ICD-10-CM | POA: Diagnosis not present

## 2024-06-12 DIAGNOSIS — M1A09X0 Idiopathic chronic gout, multiple sites, without tophus (tophi): Secondary | ICD-10-CM | POA: Diagnosis not present

## 2024-06-12 DIAGNOSIS — Z6830 Body mass index (BMI) 30.0-30.9, adult: Secondary | ICD-10-CM | POA: Diagnosis not present
